# Patient Record
Sex: Female | Born: 1990 | Race: White | Hispanic: No | Smoking: Never smoker
Health system: Southern US, Community
[De-identification: ages and names within clinical notes are randomized; demographics above are authoritative.]

## PROBLEM LIST (undated history)

## (undated) DIAGNOSIS — D693 Immune thrombocytopenic purpura: Secondary | ICD-10-CM

## (undated) DIAGNOSIS — Z973 Presence of spectacles and contact lenses: Secondary | ICD-10-CM

## (undated) DIAGNOSIS — N809 Endometriosis, unspecified: Secondary | ICD-10-CM

## (undated) DIAGNOSIS — Q5128 Other doubling of uterus, other specified: Secondary | ICD-10-CM

## (undated) DIAGNOSIS — R351 Nocturia: Secondary | ICD-10-CM

## (undated) DIAGNOSIS — K589 Irritable bowel syndrome without diarrhea: Secondary | ICD-10-CM

## (undated) DIAGNOSIS — Z87442 Personal history of urinary calculi: Secondary | ICD-10-CM

## (undated) DIAGNOSIS — Q512 Other doubling of uterus, unspecified: Secondary | ICD-10-CM

## (undated) DIAGNOSIS — R350 Frequency of micturition: Secondary | ICD-10-CM

---
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
## 2012-02-17 HISTORY — PX: URETEROLITHOTOMY: SHX71

## 2015-01-17 DIAGNOSIS — D693 Immune thrombocytopenic purpura: Secondary | ICD-10-CM | POA: Diagnosis present

## 2015-07-12 DIAGNOSIS — R000 Tachycardia, unspecified: Secondary | ICD-10-CM | POA: Diagnosis not present

## 2015-07-16 DIAGNOSIS — N949 Unspecified condition associated with female genital organs and menstrual cycle: Secondary | ICD-10-CM | POA: Diagnosis not present

## 2015-07-23 DIAGNOSIS — N946 Dysmenorrhea, unspecified: Secondary | ICD-10-CM | POA: Diagnosis not present

## 2015-08-16 ENCOUNTER — Encounter (HOSPITAL_BASED_OUTPATIENT_CLINIC_OR_DEPARTMENT_OTHER): Payer: Self-pay | Admitting: *Deleted

## 2015-08-16 NOTE — Progress Notes (Signed)
NPO AFTER MN.  ARRIVE AT 0900.  [facility], TO BE FAXED.  WILL TAKE [place] AM DOS W/ SIPS OF WATER.

## 2015-08-22 DIAGNOSIS — D649 Anemia, unspecified: Secondary | ICD-10-CM | POA: Diagnosis not present

## 2015-08-26 NOTE — H&P (Signed)
Heidi Blair is a 25 y.o. female , originally referred to me by Dr. Juliene Pina, for partial uterine septum.  She reports dysmenorrhea and pelvic pain as well as a history of Thrombocytopenia.  Patient would like to preserve her childbearing potential.  Pertinent Gynecological History: Menses: Normal Bleeding: Normal Contraception: Nuvaring DES exposure: denies Blood transfusions: none Sexually transmitted diseases: no past history Previous GYN Procedures: No  Last mammogram: normal Last pap: normal  OB History: G 0   Menstrual History: Menarche age: 89 No LMP recorded.    Past Medical History  Diagnosis Date  . Endometriosis   . Uterus, septate   . ITP (idiopathic thrombocytopenic purpura)     mild - dx 2011-- per pt stable and montiored by pcp (dr Valentino Hue)  . History of kidney stones   . IBS (irritable bowel syndrome)     intermittant diarrhea  . Frequency of urination   . Nocturia   . Wears glasses                     Past Surgical History  Procedure Laterality Date  . Wisdom tooth extraction  2011  . Colonoscopy  2011  . Ureterolithotomy  2014             History reviewed. No pertinent family history. No hereditary disease.  No cancer of breast, ovary, uterus. No cutaneous leiomyomatosis or renal cell carcinoma.  Social History   Social History  . Marital Status: Single    Spouse Name: N/A  . Number of Children: N/A  . Years of Education: N/A   Occupational History  . Not on file.   Social History Main Topics  . Smoking status: Never Smoker   . Smokeless tobacco: Never Used  . Alcohol Use: Yes     Comment: RARE  . Drug Use: No  . Sexual Activity: Not on file   Other Topics Concern  . Not on file   Social History Narrative  . No narrative on file    Allergies  Allergen Reactions  . Hydrocodone Nausea And Vomiting    SEVERE    No current facility-administered medications on file prior to encounter.   No current outpatient  prescriptions on file prior to encounter.     Review of Systems  Constitutional: Negative.   HENT: Negative.   Eyes: Negative.   Respiratory: Negative.   Cardiovascular: Negative.   Gastrointestinal: Negative.   Genitourinary: Negative.   Musculoskeletal: Negative.   Skin: Negative.   Neurological: Negative.   Endo/Heme/Allergies: Negative.   Psychiatric/Behavioral: Negative.      Physical Exam  Ht  (1.575 m)  Wt 58.968 kg (130 lb)  BMI 23.77 kg/m2  LMP 08/02/2015 (Approximate) Constitutional: She is oriented to person, place, and time. She appears well-developed and well-nourished.  HENT:  Head: Normocephalic and atraumatic.  Nose: Nose normal.  Mouth/Throat: Oropharynx is clear and moist. No oropharyngeal exudate.  Eyes: Conjunctivae normal and EOM are normal. Pupils are equal, round, and reactive to light. No scleral icterus.  Neck: Normal range of motion. Neck supple. No tracheal deviation present. No thyromegaly present.  Cardiovascular: Normal rate.   Respiratory: Effort normal and breath sounds normal.  GI: Soft. Bowel sounds are normal. She exhibits no distension and no mass. There is no tenderness.  Lymphadenopathy:    She has no cervical adenopathy.  Neurological: She is alert and orieEMBERLYNN RIGGAN, place, and time. She has normal reflexes.  Skin: Skin is warm.  Psychiatric: She has a normal mood and affect. Her behavior is normal. Judgment and thought content normal.       Assessment/Plan:   I discussed the options for treatment of patient's symptoms with her: medical treatment of dysmenorrhea versus hysteroscopy and incision of septum (which would allow us to insert the Mirena), versus hysteroscopy and incision of septum with laparoscopy and possible ablation and excision of endometriosis. I reviewed the benefits, risks, as well as expected chances of future improvements. I specifically reviewed reported increase in obstetric complications such as  miscarriage, malpresentation, premature birth due to uterine septum.  Case series showed a decrease in such complications following hysteroscopic incision of uterine septum. Patient would like to proceed with both hysteroscopic incision of septum and laparoscopy and excision of endometriosis

## 2015-08-27 ENCOUNTER — Encounter (HOSPITAL_BASED_OUTPATIENT_CLINIC_OR_DEPARTMENT_OTHER)
Admission: RE | Disposition: A | Discharge status: Home / Self Care | Payer: Self-pay | Source: Ambulatory Visit | Attending: Obstetrics and Gynecology

## 2015-08-27 ENCOUNTER — Ambulatory Visit (HOSPITAL_BASED_OUTPATIENT_CLINIC_OR_DEPARTMENT_OTHER)
Admission: RE | Admit: 2015-08-27 | Discharge: 2015-08-27 | Disposition: A | Discharge status: Home / Self Care | Payer: BLUE CROSS/BLUE SHIELD | Source: Ambulatory Visit | Attending: Obstetrics and Gynecology | Admitting: Obstetrics and Gynecology

## 2015-08-27 ENCOUNTER — Ambulatory Visit (HOSPITAL_BASED_OUTPATIENT_CLINIC_OR_DEPARTMENT_OTHER): Payer: BLUE CROSS/BLUE SHIELD | Admitting: Anesthesiology

## 2015-08-27 ENCOUNTER — Encounter (HOSPITAL_BASED_OUTPATIENT_CLINIC_OR_DEPARTMENT_OTHER): Payer: Self-pay | Admitting: *Deleted

## 2015-08-27 DIAGNOSIS — N803 Endometriosis of pelvic peritoneum: Secondary | ICD-10-CM | POA: Diagnosis not present

## 2015-08-27 DIAGNOSIS — N808 Other endometriosis: Secondary | ICD-10-CM | POA: Insufficient documentation

## 2015-08-27 DIAGNOSIS — Q512 Other doubling of uterus: Secondary | ICD-10-CM | POA: Insufficient documentation

## 2015-08-27 DIAGNOSIS — R102 Pelvic and perineal pain: Secondary | ICD-10-CM | POA: Diagnosis not present

## 2015-08-27 DIAGNOSIS — N809 Endometriosis, unspecified: Secondary | ICD-10-CM | POA: Diagnosis not present

## 2015-08-27 HISTORY — DX: Endometriosis, unspecified: N80.9

## 2015-08-27 HISTORY — DX: Presence of spectacles and contact lenses: Z97.3

## 2015-08-27 HISTORY — DX: Frequency of micturition: R35.0

## 2015-08-27 HISTORY — DX: Immune thrombocytopenic purpura: D69.3

## 2015-08-27 HISTORY — DX: Other and unspecified doubling of uterus: Q51.28

## 2015-08-27 HISTORY — PX: HYSTEROSCOPY: SHX211

## 2015-08-27 HISTORY — DX: Nocturia: R35.1

## 2015-08-27 HISTORY — DX: Personal history of urinary calculi: Z87.442

## 2015-08-27 HISTORY — DX: Irritable bowel syndrome without diarrhea: K58.9

## 2015-08-27 HISTORY — DX: Other doubling of uterus, unspecified: Q51.20

## 2015-08-27 HISTORY — PX: LAPAROSCOPY: SHX197

## 2015-08-27 LAB — POCT HEMOGLOBIN-HEMACUE: Hemoglobin: 13.4 g/dL (ref 12.0–15.0)

## 2015-08-27 LAB — POCT PREGNANCY, URINE: PREG TEST UR: NEGATIVE

## 2015-08-27 SURGERY — HYSTEROSCOPY
Anesthesia: General | Site: Abdomen

## 2015-08-27 MED ORDER — DEXAMETHASONE SODIUM PHOSPHATE 4 MG/ML IJ SOLN
INTRAMUSCULAR | Status: DC | PRN
  Administered 2015-08-27: 8 mg via INTRAVENOUS

## 2015-08-27 MED ORDER — PROPOFOL 10 MG/ML IV BOLUS
INTRAVENOUS | Status: AC
  Filled 2015-08-27: qty 20

## 2015-08-27 MED ORDER — ONDANSETRON HCL 4 MG/2ML IJ SOLN
4.0000 mg | Freq: Once | INTRAMUSCULAR | Status: DC | PRN
  Filled 2015-08-27: qty 2

## 2015-08-27 MED ORDER — SODIUM CHLORIDE 0.9 % IR SOLN
Status: DC | PRN
  Administered 2015-08-27: 3000 mL
  Administered 2015-08-27: 1000 mL

## 2015-08-27 MED ORDER — FENTANYL CITRATE (PF) 250 MCG/5ML IJ SOLN
INTRAMUSCULAR | Status: AC
  Filled 2015-08-27: qty 5

## 2015-08-27 MED ORDER — PROPOFOL 10 MG/ML IV BOLUS
INTRAVENOUS | Status: DC | PRN
  Administered 2015-08-27: 100 mg via INTRAVENOUS

## 2015-08-27 MED ORDER — MIDAZOLAM HCL 2 MG/2ML IJ SOLN
INTRAMUSCULAR | Status: AC
  Filled 2015-08-27: qty 2

## 2015-08-27 MED ORDER — TRAMADOL HCL 50 MG PO TABS: 100.0000 mg | ORAL_TABLET | Freq: Four times a day (QID) | ORAL | Status: DC | PRN

## 2015-08-27 MED ORDER — ROCURONIUM BROMIDE 100 MG/10ML IV SOLN
INTRAVENOUS | Status: DC | PRN
  Administered 2015-08-27: 50 mg via INTRAVENOUS

## 2015-08-27 MED ORDER — ONDANSETRON HCL 4 MG/2ML IJ SOLN
INTRAMUSCULAR | Status: DC | PRN
  Administered 2015-08-27: 4 mg via INTRAVENOUS

## 2015-08-27 MED ORDER — LIDOCAINE HCL (CARDIAC) 20 MG/ML IV SOLN
INTRAVENOUS | Status: DC | PRN
  Administered 2015-08-27: 40 mg via INTRAVENOUS

## 2015-08-27 MED ORDER — MEPERIDINE HCL 25 MG/ML IJ SOLN
6.2500 mg | INTRAMUSCULAR | Status: DC | PRN
  Filled 2015-08-27: qty 1

## 2015-08-27 MED ORDER — HYDROMORPHONE HCL 1 MG/ML IJ SOLN
INTRAMUSCULAR | Status: AC
  Filled 2015-08-27: qty 1

## 2015-08-27 MED ORDER — CEFAZOLIN SODIUM-DEXTROSE 2-4 GM/100ML-% IV SOLN
2.0000 g | INTRAVENOUS | Status: AC
  Administered 2015-08-27: 2 g via INTRAVENOUS
  Filled 2015-08-27: qty 100

## 2015-08-27 MED ORDER — SUGAMMADEX SODIUM 200 MG/2ML IV SOLN
INTRAVENOUS | Status: AC
  Filled 2015-08-27: qty 2

## 2015-08-27 MED ORDER — ESTRADIOL 2 MG PO TABS: 3.0000 mg | ORAL_TABLET | Freq: Two times a day (BID) | ORAL | Status: DC

## 2015-08-27 MED ORDER — DEXAMETHASONE SODIUM PHOSPHATE 10 MG/ML IJ SOLN
INTRAMUSCULAR | Status: AC
  Filled 2015-08-27: qty 1

## 2015-08-27 MED ORDER — FENTANYL CITRATE (PF) 100 MCG/2ML IJ SOLN
INTRAMUSCULAR | Status: DC | PRN
  Administered 2015-08-27: 150 ug via INTRAVENOUS
  Administered 2015-08-27 (×2): 50 ug via INTRAVENOUS

## 2015-08-27 MED ORDER — ONDANSETRON HCL 4 MG/2ML IJ SOLN
INTRAMUSCULAR | Status: AC
  Filled 2015-08-27: qty 2

## 2015-08-27 MED ORDER — SUGAMMADEX SODIUM 200 MG/2ML IV SOLN
INTRAVENOUS | Status: DC | PRN
  Administered 2015-08-27: 125.2 mg via INTRAVENOUS

## 2015-08-27 MED ORDER — ONDANSETRON HCL 4 MG PO TABS: 4.0000 mg | ORAL_TABLET | Freq: Three times a day (TID) | ORAL | Status: DC | PRN

## 2015-08-27 MED ORDER — VASOPRESSIN 20 UNIT/ML IV SOLN
INTRAVENOUS | Status: DC | PRN
  Administered 2015-08-27: 1 mL via INTRAMUSCULAR
  Administered 2015-08-27: 8 mL via INTRAMUSCULAR

## 2015-08-27 MED ORDER — CEFAZOLIN SODIUM-DEXTROSE 2-4 GM/100ML-% IV SOLN
INTRAVENOUS | Status: AC
  Filled 2015-08-27: qty 100

## 2015-08-27 MED ORDER — HYDROMORPHONE HCL 2 MG PO TABS
ORAL_TABLET | ORAL | Status: AC
  Filled 2015-08-27: qty 1

## 2015-08-27 MED ORDER — HYDROMORPHONE HCL 1 MG/ML IJ SOLN
0.2500 mg | INTRAMUSCULAR | Status: DC | PRN
  Administered 2015-08-27: 0.25 mg via INTRAVENOUS
  Filled 2015-08-27: qty 1

## 2015-08-27 MED ORDER — LACTATED RINGERS IV SOLN
INTRAVENOUS | Status: DC
  Administered 2015-08-27 (×2): via INTRAVENOUS
  Filled 2015-08-27: qty 1000

## 2015-08-27 MED ORDER — HYDROMORPHONE HCL 2 MG PO TABS
2.0000 mg | ORAL_TABLET | Freq: Four times a day (QID) | ORAL | Status: DC | PRN
  Administered 2015-08-27: 2 mg via ORAL
  Filled 2015-08-27: qty 1

## 2015-08-27 MED ORDER — 0.9 % SODIUM CHLORIDE (POUR BTL) OPTIME
TOPICAL | Status: DC | PRN
  Administered 2015-08-27: 500 mL

## 2015-08-27 MED ORDER — MIDAZOLAM HCL 5 MG/5ML IJ SOLN
INTRAMUSCULAR | Status: DC | PRN
  Administered 2015-08-27: 2 mg via INTRAVENOUS

## 2015-08-27 SURGICAL SUPPLY — 68 items
BIPOLAR CUTTING LOOP 21FR (ELECTRODE)
BLADE SURG 11 STRL SS (BLADE) ×3 IMPLANT
CANISTER SUCTION 2500CC (MISCELLANEOUS) ×9 IMPLANT
CATH ROBINSON RED A/P 16FR (CATHETERS) ×3 IMPLANT
COVER BACK TABLE 60X90IN (DRAPES) ×3 IMPLANT
COVER MAYO STAND STRL (DRAPES) ×3 IMPLANT
DRAPE LG THREE QUARTER DISP (DRAPES) ×6 IMPLANT
DRAPE UNDERBUTTOCKS STRL (DRAPE) ×3 IMPLANT
DRSG OPSITE POSTOP 3X4 (GAUZE/BANDAGES/DRESSINGS) IMPLANT
ELECT BIPOLAR POINTED 21FR (MISCELLANEOUS)
ELECT NEEDLE TIP 2.8 STRL (NEEDLE) ×3 IMPLANT
ELECT REM PT RETURN 9FT ADLT (ELECTROSURGICAL) ×3
ELECTRODE LOOP CTNG BIPLR 21FR (MISCELLANEOUS) IMPLANT
ELECTRODE REM PT RTRN 9FT ADLT (ELECTROSURGICAL) ×2 IMPLANT
EVACUATOR SMOKE 8.L (FILTER) IMPLANT
GLOVE BIO SURGEON STRL SZ8 (GLOVE) ×3 IMPLANT
GLOVE BIOGEL PI IND STRL 8.5 (GLOVE) ×2 IMPLANT
GLOVE BIOGEL PI INDICATOR 8.5 (GLOVE) ×1
GLOVE INDICATOR 7.5 STRL GRN (GLOVE) ×9 IMPLANT
GOWN STRL REUS W/ TWL LRG LVL3 (GOWN DISPOSABLE) ×2 IMPLANT
GOWN STRL REUS W/ TWL XL LVL3 (GOWN DISPOSABLE) ×2 IMPLANT
GOWN STRL REUS W/TWL LRG LVL3 (GOWN DISPOSABLE) ×1
GOWN STRL REUS W/TWL XL LVL3 (GOWN DISPOSABLE) ×4 IMPLANT
KIT ROOM TURNOVER WOR (KITS) ×3 IMPLANT
LEGGING LITHOTOMY PAIR STRL (DRAPES) ×6 IMPLANT
LIQUID BAND (GAUZE/BANDAGES/DRESSINGS) ×3 IMPLANT
LOOP CUTTING BIPOLAR 21FR (ELECTRODE) IMPLANT
MANIPULATOR UTERINE 4.5 ZUMI (MISCELLANEOUS) ×3 IMPLANT
NEEDLE HYPO 25X1 1.5 SAFETY (NEEDLE) ×3 IMPLANT
NEEDLE INSUFFLATION 14GA 120MM (NEEDLE) ×3 IMPLANT
NEEDLE SPNL 22GX3.5 QUINCKE BK (NEEDLE) ×3 IMPLANT
NS IRRIG 500ML POUR BTL (IV SOLUTION) ×3 IMPLANT
PACK BASIN DAY SURGERY FS (CUSTOM PROCEDURE TRAY) ×3 IMPLANT
PACK LAPAROSCOPY II (CUSTOM PROCEDURE TRAY) ×3 IMPLANT
PAD OB MATERNITY 4.3X12.25 (PERSONAL CARE ITEMS) ×3 IMPLANT
PADDING ION DISPOSABLE (MISCELLANEOUS) ×3 IMPLANT
PENCIL BUTTON HOLSTER BLD 10FT (ELECTRODE) ×3 IMPLANT
POUCH SPECIMEN RETRIEVAL 10MM (ENDOMECHANICALS) IMPLANT
SCALPEL HARMONIC ACE (MISCELLANEOUS) IMPLANT
SEALER TISSUE G2 CVD JAW 45CM (ENDOMECHANICALS) IMPLANT
SEPRAFILM MEMBRANE 5X6 (MISCELLANEOUS) ×3 IMPLANT
SET IRRIG TUBING LAPAROSCOPIC (IRRIGATION / IRRIGATOR) ×3 IMPLANT
SET IRRIG Y TYPE TUR BLADDER L (SET/KITS/TRAYS/PACK) ×3 IMPLANT
SOLUTION ANTI FOG 6CC (MISCELLANEOUS) ×3 IMPLANT
SUT MNCRL AB 4-0 PS2 18 (SUTURE) ×3 IMPLANT
SUT PROLENE 0 CT 1 30 (SUTURE) IMPLANT
SUT VIC AB 2-0 CT1 27 (SUTURE)
SUT VIC AB 2-0 CT1 TAPERPNT 27 (SUTURE) IMPLANT
SUT VIC AB 2-0 CT2 27 (SUTURE) IMPLANT
SUT VIC AB 2-0 UR6 27 (SUTURE) IMPLANT
SUT VIC AB 4-0 SH 27 (SUTURE) ×1
SUT VIC AB 4-0 SH 27XBRD (SUTURE) ×2 IMPLANT
SUT VICRYL 0 TIES 12 18 (SUTURE) IMPLANT
SYR 30ML LL (SYRINGE) ×3 IMPLANT
SYR 3ML 18GX1 1/2 (SYRINGE) ×6 IMPLANT
SYR 3ML 23GX1 SAFETY (SYRINGE) ×3 IMPLANT
SYR 5ML LL (SYRINGE) ×3 IMPLANT
SYR CONTROL 10ML LL (SYRINGE) ×6 IMPLANT
SYRINGE 12CC LL (MISCELLANEOUS) ×3 IMPLANT
TOWEL OR 17X24 6PK STRL BLUE (TOWEL DISPOSABLE) ×6 IMPLANT
TRAY DSU PREP LF (CUSTOM PROCEDURE TRAY) ×3 IMPLANT
TRAY FOLEY CATH SILVER 14FR (SET/KITS/TRAYS/PACK) ×3 IMPLANT
TROCAR OPTI TIP 5M 100M (ENDOMECHANICALS) ×9 IMPLANT
TROCAR XCEL DIL TIP R 11M (ENDOMECHANICALS) IMPLANT
TUBE CONNECTING 12X1/4 (SUCTIONS) ×3 IMPLANT
TUBING INSUFFLATION 10FT LAP (TUBING) ×6 IMPLANT
WARMER LAPAROSCOPE (MISCELLANEOUS) ×3 IMPLANT
WATER STERILE IRR 500ML POUR (IV SOLUTION) ×3 IMPLANT

## 2015-08-27 NOTE — Anesthesia Procedure Notes (Signed)
Procedure Name: Intubation Date/Time: 08/27/2015 11:05 AM Performed by: Rica RecordsICKELTON, Babe Clenney Pre-anesthesia Checklist: Patient identified, Emergency Drugs available, Suction available and Patient being monitored Patient Re-evaluated:Patient Re-evaluated prior to inductionOxygen Delivery Method: Circle system utilized Preoxygenation: Pre-oxygenation with 100% oxygen Intubation Type: IV induction Ventilation: Mask ventilation without difficulty Laryngoscope Size: Miller and 2 Grade View: Grade II Tube type: Oral Number of attempts: 1 Airway Equipment and Method: Stylet Placement Confirmation: ETT inserted through vocal cords under direct vision and breath sounds checked- equal and bilateral Secured at: 22 cm Tube secured with: Tape Dental Injury: Teeth and Oropharynx as per pre-operative assessment

## 2015-08-27 NOTE — Discharge Instructions (Signed)

## 2015-08-27 NOTE — Transfer of Care (Signed)
Immediate Anesthesia Transfer of Care Note  Patient: Heidi BaldyJennifer M Haynie  Procedure(s) Performed: Procedure(s): HYSTEROSCOPY, RESECTION OF UTERINE SEPTUM,  INSERTION OF SEPRAFILM CHANNEL  (N/A) LAPAROSCOPY,  EXCISION OF ENDOMETROSIS, CHROMOTUBATION, LYSIS OF ADHESIONS (N/A)  Patient Location: PACU  Anesthesia Type:General  Level of Consciousness: awake, alert  and oriented  Airway & Oxygen Therapy: Patient Spontanous Breathing and Patient connected to nasal cannula oxygen  Post-op Assessment: Report given to RN and Post -op Vital signs reviewed and stable  Post vital signs: Reviewed and stable  Last Vitals:  Filed Vitals:   08/27/15 0841  BP: 125/72  Pulse: 93  Temp: 37.8 C  Resp: 20    Last Pain:  Filed Vitals:   08/27/15 0922  PainSc: 1       Patients Stated Pain Goal: 7 (08/27/15 0857)  Complications: No apparent anesthesia complications

## 2015-08-27 NOTE — Anesthesia Preprocedure Evaluation (Addendum)
Anesthesia Evaluation  Patient identified by MRN, date of birth, ID band Patient awake    Reviewed: Allergy & Precautions, NPO status , Patient's Chart, lab work & pertinent test results  Airway Mallampati: I  TM Distance: >3 FB Neck ROM: Full    Dental   Pulmonary neg pulmonary ROS,    Pulmonary exam normal        Cardiovascular Exercise Tolerance: Good negative cardio ROS Normal cardiovascular exam     Neuro/Psych negative neurological ROS  negative psych ROS   GI/Hepatic negative GI ROS, Neg liver ROS,   Endo/Other  negative endocrine ROS  Renal/GU negative Renal ROS  negative genitourinary   Musculoskeletal negative musculoskeletal ROS (+)   Abdominal   Peds negative pediatric ROS (+)  Hematology negative hematology ROS (+)   Anesthesia Other Findings   Reproductive/Obstetrics negative OB ROS                            Anesthesia Physical Anesthesia Plan  ASA: II  Anesthesia Plan: General   Post-op Pain Management:    Induction: Intravenous  Airway Management Planned: Oral ETT  Additional Equipment:   Intra-op Plan:   Post-operative Plan: Extubation in OR  Informed Consent: I have reviewed the patients History and Physical, chart, labs and discussed the procedure including the risks, benefits and alternatives for the proposed anesthesia with the patient or authorized representative who has indicated his/her understanding and acceptance.     Plan Discussed with: CRNA and Surgeon  Anesthesia Plan Comments:         Anesthesia Quick Evaluation

## 2015-08-27 NOTE — Anesthesia Postprocedure Evaluation (Signed)
Anesthesia Post Note  Patient: Heidi Blair  Procedure(s) Performed: Procedure(s) (LRB): HYSTEROSCOPY, RESECTION OF UTERINE SEPTUM,  INSERTION OF SEPRAFILM CHANNEL  (N/A) LAPAROSCOPY,  EXCISION OF ENDOMETROSIS, CHROMOTUBATION, LYSIS OF ADHESIONS (N/A)  Patient location during evaluation: PACU Anesthesia Type: General Level of consciousness: awake and alert Pain management: pain level controlled Vital Signs Assessment: post-procedure vital signs reviewed and stable Respiratory status: spontaneous breathing, nonlabored ventilation, respiratory function stable and patient connected to nasal cannula oxygen Cardiovascular status: stable and blood pressure returned to baseline Anesthetic complications: no    Last Vitals:  Filed Vitals:   08/27/15 1415 08/27/15 1533  BP: 111/63 106/65  Pulse: 94 93  Temp:  36.9 C  Resp: 12 16    Last Pain:  Filed Vitals:   08/27/15 1534  PainSc: 2                  Cathy Crounse DAVID

## 2015-08-27 NOTE — Anesthesia Postprocedure Evaluation (Signed)
Anesthesia Post Note  Patient: Violet BaldyJennifer M Jorge  Procedure(s) Performed: Procedure(s) (LRB): HYSTEROSCOPY, RESECTION OF UTERINE SEPTUM,  INSERTION OF SEPRAFILM CHANNEL  (N/A) LAPAROSCOPY,  EXCISION OF ENDOMETROSIS, CHROMOTUBATION, LYSIS OF ADHESIONS (N/A)  Patient location during evaluation: PACU Anesthesia Type: General Level of consciousness: awake and alert and oriented Pain management: pain level controlled Vital Signs Assessment: post-procedure vital signs reviewed and stable Respiratory status: spontaneous breathing, nonlabored ventilation and respiratory function stable Cardiovascular status: blood pressure returned to baseline and stable Postop Assessment: no signs of nausea or vomiting Anesthetic complications: no    Last Vitals:  Filed Vitals:   08/27/15 1400 08/27/15 1415  BP: 110/61 111/63  Pulse: 95 94  Temp:    Resp: 17 12    Last Pain:  Filed Vitals:   08/27/15 1416  PainSc: 2                  Bodie Abernethy A.

## 2015-08-29 NOTE — Op Note (Signed)
08/27/2015  11:54 AM   PRE-OPERATIVE DIAGNOSIS:  ENDOMETRIOSIS, COMPLETE UTERINE SEPTUM  POST-OPERATIVE DIAGNOSIS:  ENDOMETRIOSIS OF PELVIC PERITONEUM, STAGE I, COMPLETE UTERINE SEPTUM  PROCEDURE:  HYSTEROSCOPY, RESECTION OF UTERINE SEPTUM,  INSERTION OF SEPRAFILM STENT,  LAPAROSCOPY,  EXCISION OF ENDOMETROSIS, CHROMOTUBATION, LYSIS OF ADHESIONS   SURGEON:  Surgeon(s) and Role:    * Fermin Schwab, MD - Primary  ANESTHESIA:   general  EBL:  Total I/O In: 2140 [P.O.:240; I.V.:1900] Out: 170 [Urine:150; Blood:20]   LOCAL MEDICATIONS USED:  BUPIVICAINE   SPECIMEN:  Peritoneal biopsies from left and right uterosacral ligament to pathology  FINDINGS: On exam under anesthesia, external genitalia, Bartholin's, Skene's, urethra were normal.  The vagina was normal.  There was one cervical opening.  Bimanual exam showed them normal size uterus with a straight fundus.  There were no adnexal masses palpable.  No posterior cul-de-sac nodularity was palpable. Uterus sounded to 7 cm.  Endocervical canal was normal.  Just above the internal cervical os, a thin midline uterine septum started and extended all the way up to the fundus. Both tubal ostia were seen.  The endometrium appeared thin, as the patient has been on oral contraceptives. On laparoscopy, liver edge, gallbladder, diaphragm surfaces were normal.  The appendix was not visualized. Anterior cul-de-sac peritoneum was normal. The uterine fundus appeared normal. There was a pigmented lesion on the right proximal tubal insertion site.  This was not ablated because of its proximity to the fallopian tube. There were scattered lesions of brown pigmented endometriosis throughout the posterior cul-de-sac. These were superficial.  There were also brown lesions with surrounding fibrosis that appear to penetrate deeper, at the insertion site of both uterosacral ligaments. Additionally there were numerous lesions of brown pigmented endometriosis on  the left ovarian fossa.  There were 3 spots of brown lesions on the right ovarian fossa peritoneum.  All of these were ablated. Both fallopian tubes appeared normal proximally and distally. Fimbria were rated at 5 out of 5. Both ovaries appeared normal, with smooth surfaces. The left tube was patent to chromotubation.  The right tube did not take up the methylene blue but we think this may have been an artifact.  DESCRIPTION OF THE PROCEDURE: Patient was placed in dorsal supine position. General anesthesia was  administered. She was placed in lithotomy position. She was prepped and draped in  . A vaginal speculum was placed. A dilute vasopressin solution containing 0.33 units per milliliter was injected into the cervical stroma x5 cc. A Slimline hysteroscope with 30 lens was inserted into the canal and above findings were noted. Distention medium was normal saline. Distention method was a hysteroscopic pump set at 80 mm mercury. Above findings were noted.  The patient has used Cytotec 800 mcg the night before the procedure.  Therefore her cervix was sufficiently dilated.  We switched to 8 mm slimline bipolar resectoscope with 12 lens which had a knife electrode attached to it.  Using 3 out of 5 setting in the cutting mode and 0 out of 5 in the coagulating mode, the septum was carefully incised from caudal to cephalad direction.  We continued to dissection in a single plane up to the level of an imaginary line connecting the 2 tubal ostia.  Normal uterine configuration was achieved.  Hemostasis was checked.  The procedure was terminated and a ZUMI catheter was inserted and its balloon was inflated with 3 cc of saline, for use as a uterine manipulator and methylene blue injector during the  laparoscopy. No hysteroscopic deficit was noted for this portion of the procedure. The surgeon was then regloved and an operative field was created on the abdomen. After preemptive infiltration of all incision sites with  0.25% bupivacaine with 1 in 200,000 an intraumbilical 5 mm long vertical skin incision was made. A Verress needle was inserted and its correct location was verified. A pneumoperitoneum was created with carbon dioxide.  A trocar was inserted and a 30 5 mm laparoscope was inserted.  Video laparoscopy was started. Under direct visualization 2 more 5 mm incisions were made, one in each lower quadrant.  Corresponding sleeves were inserted and these ports were used to insert operative ancillary instruments throughout the procedure. Above findings were noted.  The patient was placed in Trendelenburg position. We used a needle electrode with 35 W of cutting current to excise the deeper lesions of endometriosis on the uterosacral ligaments in a discoid manner. We then used hydrodissection method to elevate the peritoneum in both ovarian fossa.  Using needle electrode in 35 W of cutting current setting, the brown lesions of endometriosis were vaporized until a healthy subperitoneal tissue was encountered. Chromotubation was performed and left tubal patency was confirmed. Pelvis was copiously irrigated and then aspirated. Hemostasis and lap pad count and instrument counts were checked.  Instruments were removed.  The gas was allowed to escape.  Dermabond was applied to all 3 skin incisions. As an intrauterine adhesion barrier, we rolled up a large sheet of Seprafilm around smooth DeBakey forceps and inserted it to 7 cm into the uterus and trimmed it off. The patient tolerated the procedure well and was transferred to recovery room in satisfactory condition.  Fermin SchwabYALCINKAYA,Eulamae Greenstein, MD

## 2015-08-30 ENCOUNTER — Encounter (HOSPITAL_BASED_OUTPATIENT_CLINIC_OR_DEPARTMENT_OTHER): Payer: Self-pay | Admitting: Obstetrics and Gynecology

## 2015-09-23 DIAGNOSIS — N809 Endometriosis, unspecified: Secondary | ICD-10-CM | POA: Diagnosis not present

## 2015-11-28 ENCOUNTER — Emergency Department (HOSPITAL_BASED_OUTPATIENT_CLINIC_OR_DEPARTMENT_OTHER)
Admission: EM | Admit: 2015-11-28 | Discharge: 2015-11-28 | Disposition: A | Discharge status: Home / Self Care | Payer: BLUE CROSS/BLUE SHIELD | Attending: Emergency Medicine | Admitting: Emergency Medicine

## 2015-11-28 ENCOUNTER — Emergency Department (HOSPITAL_BASED_OUTPATIENT_CLINIC_OR_DEPARTMENT_OTHER): Payer: BLUE CROSS/BLUE SHIELD

## 2015-11-28 ENCOUNTER — Encounter (HOSPITAL_BASED_OUTPATIENT_CLINIC_OR_DEPARTMENT_OTHER): Payer: Self-pay | Admitting: Emergency Medicine

## 2015-11-28 DIAGNOSIS — K529 Noninfective gastroenteritis and colitis, unspecified: Secondary | ICD-10-CM | POA: Diagnosis not present

## 2015-11-28 DIAGNOSIS — R1032 Left lower quadrant pain: Secondary | ICD-10-CM | POA: Insufficient documentation

## 2015-11-28 DIAGNOSIS — R112 Nausea with vomiting, unspecified: Secondary | ICD-10-CM | POA: Diagnosis present

## 2015-11-28 LAB — CBC
HEMATOCRIT: 44.5 % (ref 36.0–46.0)
HEMOGLOBIN: 15.4 g/dL — AB (ref 12.0–15.0)
MCH: 29.6 pg (ref 26.0–34.0)
MCHC: 34.6 g/dL (ref 30.0–36.0)
MCV: 85.6 fL (ref 78.0–100.0)
Platelets: 120 10*3/uL — ABNORMAL LOW (ref 150–400)
RBC: 5.2 MIL/uL — ABNORMAL HIGH (ref 3.87–5.11)
RDW: 12.4 % (ref 11.5–15.5)
WBC: 12.1 10*3/uL — ABNORMAL HIGH (ref 4.0–10.5)

## 2015-11-28 LAB — COMPREHENSIVE METABOLIC PANEL
ALBUMIN: 4 g/dL (ref 3.5–5.0)
ALT: 13 U/L — ABNORMAL LOW (ref 14–54)
AST: 23 U/L (ref 15–41)
Alkaline Phosphatase: 36 U/L — ABNORMAL LOW (ref 38–126)
Anion gap: 14 (ref 5–15)
BILIRUBIN TOTAL: 0.9 mg/dL (ref 0.3–1.2)
BUN: 16 mg/dL (ref 6–20)
CHLORIDE: 105 mmol/L (ref 101–111)
CO2: 17 mmol/L — AB (ref 22–32)
Calcium: 8.9 mg/dL (ref 8.9–10.3)
Creatinine, Ser: 0.74 mg/dL (ref 0.44–1.00)
GFR calc Af Amer: >60 mL/min (ref >60)
GFR calc non Af Amer: >60 mL/min (ref >60)
GLUCOSE: 109 mg/dL — AB (ref 65–99)
POTASSIUM: 3.5 mmol/L (ref 3.5–5.1)
SODIUM: 136 mmol/L (ref 135–145)
TOTAL PROTEIN: 8 g/dL (ref 6.5–8.1)

## 2015-11-28 LAB — I-STAT CG4 LACTIC ACID, ED
Lactic Acid, Venous: 2.66 mmol/L (ref 0.5–1.9)
Lactic Acid, Venous: 1.21 mmol/L (ref 0.5–1.9)

## 2015-11-28 LAB — URINALYSIS, ROUTINE W REFLEX MICROSCOPIC
BILIRUBIN URINE: NEGATIVE
GLUCOSE, UA: NEGATIVE mg/dL
KETONES UR: NEGATIVE mg/dL
Leukocytes, UA: NEGATIVE
Nitrite: NEGATIVE
PH: 6 (ref 5.0–8.0)
Protein, ur: NEGATIVE mg/dL
SPECIFIC GRAVITY, URINE: 1.018 (ref 1.005–1.030)

## 2015-11-28 LAB — PREGNANCY, URINE: Preg Test, Ur: NEGATIVE

## 2015-11-28 LAB — URINE MICROSCOPIC-ADD ON

## 2015-11-28 LAB — LIPASE, BLOOD: LIPASE: 13 U/L (ref 11–51)

## 2015-11-28 MED ORDER — SODIUM CHLORIDE 0.9 % IV BOLUS (SEPSIS)
1000.0000 mL | Freq: Once | INTRAVENOUS | Status: AC
  Administered 2015-11-28: 1000 mL via INTRAVENOUS

## 2015-11-28 MED ORDER — ACETAMINOPHEN 325 MG PO TABS
650.0000 mg | ORAL_TABLET | Freq: Once | ORAL | Status: AC
  Administered 2015-11-28: 650 mg via ORAL
  Filled 2015-11-28: qty 2

## 2015-11-28 MED ORDER — CIPROFLOXACIN HCL 500 MG PO TABS
500.0000 mg | ORAL_TABLET | Freq: Once | ORAL | Status: AC
  Administered 2015-11-28: 500 mg via ORAL
  Filled 2015-11-28: qty 1

## 2015-11-28 MED ORDER — ONDANSETRON 4 MG PO TBDP: 4.0000 mg | ORAL_TABLET | Freq: Three times a day (TID) | ORAL | 0 refills | Status: DC | PRN

## 2015-11-28 MED ORDER — CIPROFLOXACIN HCL 500 MG PO TABS: 500.0000 mg | ORAL_TABLET | Freq: Two times a day (BID) | ORAL | 0 refills | Status: DC

## 2015-11-28 MED ORDER — ONDANSETRON HCL 4 MG/2ML IJ SOLN
4.0000 mg | Freq: Once | INTRAMUSCULAR | Status: AC
  Administered 2015-11-28: 4 mg via INTRAVENOUS
  Filled 2015-11-28: qty 2

## 2015-11-28 MED ORDER — PIPERACILLIN-TAZOBACTAM 3.375 G IVPB 30 MIN
3.3750 g | Freq: Once | INTRAVENOUS | Status: AC
  Administered 2015-11-28: 3.375 g via INTRAVENOUS
  Filled 2015-11-28 (×2): qty 50

## 2015-11-28 MED ORDER — DICYCLOMINE HCL 10 MG PO CAPS
10.0000 mg | ORAL_CAPSULE | Freq: Once | ORAL | Status: AC
  Administered 2015-11-28: 10 mg via ORAL
  Filled 2015-11-28: qty 1

## 2015-11-28 MED ORDER — METRONIDAZOLE 500 MG PO TABS: 500.0000 mg | ORAL_TABLET | Freq: Two times a day (BID) | ORAL | 0 refills | Status: DC

## 2015-11-28 MED ORDER — PIPERACILLIN-TAZOBACTAM 3.375 G IVPB: 3.3750 g | Freq: Three times a day (TID) | INTRAVENOUS | Status: DC

## 2015-11-28 MED ORDER — FENTANYL CITRATE (PF) 100 MCG/2ML IJ SOLN
50.0000 ug | Freq: Once | INTRAMUSCULAR | Status: AC
  Administered 2015-11-28: 50 ug via INTRAVENOUS
  Filled 2015-11-28: qty 2

## 2015-11-28 MED ORDER — IOPAMIDOL (ISOVUE-300) INJECTION 61%
100.0000 mL | Freq: Once | INTRAVENOUS | Status: AC | PRN
  Administered 2015-11-28: 100 mL via INTRAVENOUS

## 2015-11-28 MED ORDER — OXYCODONE-ACETAMINOPHEN 5-325 MG PO TABS: 1.0000 | ORAL_TABLET | Freq: Three times a day (TID) | ORAL | 0 refills | Status: DC | PRN

## 2015-11-28 MED ORDER — DICYCLOMINE HCL 20 MG PO TABS: 20.0000 mg | ORAL_TABLET | Freq: Two times a day (BID) | ORAL | 0 refills | Status: DC

## 2015-11-28 MED ORDER — METRONIDAZOLE 500 MG PO TABS
500.0000 mg | ORAL_TABLET | Freq: Once | ORAL | Status: AC
  Administered 2015-11-28: 500 mg via ORAL
  Filled 2015-11-28: qty 1

## 2015-11-28 NOTE — ED Provider Notes (Signed)
Pt given to me at shift change with CT abd/pelvis pending. Briefly pt is a 25 y/o female with one day of left lower quadrant abdominal pain, nausea, vomiting and watery diarrhea.  She has a history of IBS and ITP, family history of Crohn's.   Workup in the ER was significant for elevated lactic acid, leukocytosis of 12.1.  She is febrile and tachycardic. She was given IV fluids, Tylenol and Zosyn. CT scan is pertinent for jejunal small bowel wall thickening possibly infectious or inflammatory enteritis,, hepatomegaly, and nonobstructing bilateral kidney stones.  Physical Exam  Constitutional: She is oriented to person, place, and time and well-developed, well-nourished, and in no distress. No distress.  HENT:  Head: Normocephalic and atraumatic.  Nose: Nose normal.  Mouth/Throat: Oropharynx is clear and moist.  Eyes: Conjunctivae and EOM are normal. Pupils are equal, round, and reactive to light. Right eye exhibits no discharge. Left eye exhibits no discharge.  Neck: Normal range of motion.  Cardiovascular: Normal rate, regular rhythm, normal heart sounds and intact distal pulses.   Pulmonary/Chest: Effort normal. No stridor. No respiratory distress.  Abdominal: Soft. Bowel sounds are normal. She exhibits no distension. There is no rebound and no guarding.  Musculoskeletal: Normal range of motion.  Neurological: She is alert and oriented to person, place, and time. She exhibits normal muscle tone. Coordination normal.  Skin: Skin is warm and dry. She is not diaphoretic.  Psychiatric: Mood, memory, affect and judgment normal.  Nursing note and vitals reviewed.    Results for orders placed or performed during the hospital encounter of 11/28/15  Lipase, blood  Result Value Ref Range   Lipase 13 11 - 51 U/L  Comprehensive metabolic panel  Result Value Ref Range   Sodium 136 135 - 145 mmol/L   Potassium 3.5 3.5 - 5.1 mmol/L   Chloride 105 101 - 111 mmol/L   CO2 17 (L) 22 - 32 mmol/L   Glucose, Bld 109 (H) 65 - 99 mg/dL   BUN 16 6 - 20 mg/dL   Creatinine, Ser 1.61 0.44 - 1.00 mg/dL   Calcium 8.9 8.9 - 09.6 mg/dL   Total Protein 8.0 6.5 - 8.1 g/dL   Albumin 4.0 3.5 - 5.0 g/dL   AST 23 15 - 41 U/L   ALT 13 (L) 14 - 54 U/L   Alkaline Phosphatase 36 (L) 38 - 126 U/L   Total Bilirubin 0.9 0.3 - 1.2 mg/dL   GFR calc non Af Amer >60 >60 mL/min   GFR calc Af Amer >60 >60 mL/min   Anion gap 14 5 - 15  CBC  Result Value Ref Range   WBC 12.1 (H) 4.0 - 10.5 K/uL   RBC 5.20 (H) 3.87 - 5.11 MIL/uL   Hemoglobin 15.4 (H) 12.0 - 15.0 g/dL   HCT 04.5 40.9 - 81.1 %   MCV 85.6 78.0 - 100.0 fL   MCH 29.6 26.0 - 34.0 pg   MCHC 34.6 30.0 - 36.0 g/dL   RDW 91.4 78.2 - 95.6 %   Platelets 120 (L) 150 - 400 K/uL  Urinalysis, Routine w reflex microscopic  Result Value Ref Range   Color, Urine YELLOW YELLOW   APPearance CLEAR CLEAR   Specific Gravity, Urine 1.018 1.005 - 1.030   pH 6.0 5.0 - 8.0   Glucose, UA NEGATIVE NEGATIVE mg/dL   Hgb urine dipstick SMALL (A) NEGATIVE   Bilirubin Urine NEGATIVE NEGATIVE   Ketones, ur NEGATIVE NEGATIVE mg/dL   Protein, ur NEGATIVE NEGATIVE  mg/dL   Nitrite NEGATIVE NEGATIVE   Leukocytes, UA NEGATIVE NEGATIVE  Pregnancy, urine  Result Value Ref Range   Preg Test, Ur NEGATIVE NEGATIVE  Urine microscopic-add on  Result Value Ref Range   Squamous Epithelial / LPF 0-5 (A) NONE SEEN   WBC, UA 0-5 0 - 5 WBC/hpf   RBC / HPF 0-5 0 - 5 RBC/hpf   Bacteria, UA RARE (A) NONE SEEN  I-Stat CG4 Lactic Acid, ED  Result Value Ref Range   Lactic Acid, Venous 2.66 (HH) 0.5 - 1.9 mmol/L   Comment NOTIFIED PHYSICIAN   I-Stat CG4 Lactic Acid, ED  (not at  [facility])  Result Value Ref Range   Lactic Acid, Venous 1.21 0.5 - 1.9 mmol/L   Ct Abdomen Pelvis W Contrast  Result Date: 11/28/2015 CLINICAL DATA:  Nausea vomiting and diarrhea. Dysuria, left lower quadrant pain and fever EXAM: CT ABDOMEN AND PELVIS WITH CONTRAST TECHNIQUE: Multidetector CT imaging of the  abdomen and pelvis was performed using the standard protocol following bolus administration of intravenous contrast. CONTRAST:  100mL ISOVUE-300 IOPAMIDOL (ISOVUE-300) INJECTION 61% COMPARISON:  None. FINDINGS: Lower chest: Lung bases clear and without acute infiltrate or effusion. Normal heart size. Hepatobiliary: The liver size is enlarged, measuring 20 cm in craniocaudad dimension. No focal hepatic abnormality is visualized. Gallbladder contains no calcified stones. A small fold is present at the gallbladder fundus. There is no intra or extrahepatic biliary dilatation. Pancreas: Unremarkable. No pancreatic ductal dilatation or surrounding inflammatory changes. Spleen: Normal in size without focal abnormality. Adrenals/Urinary Tract: Bilateral adrenal glands appear within normal limits. Kidneys show no focal masses. No hydronephrosis. There are bilateral nonobstructing kidney stones, a stone within the he lower pole of the right kidney measures 4 mm in size. Punctate stones are present in the mid left kidney. Urinary bladder is unremarkable. Stomach/Bowel: Stomach is nonenlarged. There is no evidence of a bowel obstruction. Questionable wall thickening of jejunal small bowel loops. Contrast is present in the colon. Appendix is visualized on coronal images and is within normal limits. Vascular/Lymphatic: No significant vascular findings are present. No enlarged abdominal or pelvic lymph nodes. Reproductive: Uterus and bilateral adnexa are unremarkable. Other: No free air.  No free fluid. Musculoskeletal: No acute or significant osseous findings. IMPRESSION: 1. No evidence for bowel obstruction. Suggestion of mild bowel wall thickening of jejunal small bowel loops, findings could be secondary to infectious or inflammatory enteritis. 2. Hepatomegaly 3. Nonobstructing bilateral kidney stones Electronically Signed   By: Jasmine PangKim  Fujinaga M.D.   On: 11/28/2015 18:03    Patient's lactic acid cleared after 2 L of IV  fluids. Her tachycardia improved from a heart rate of 130s to 102.  Patient's pain was controlled while in the ER, mild. She is able tolerate PO's, and she is generally well-appearing.  Discussed findings with attending physician, Dr. Rush Landmarkegeler.  He advises cipro/flagyl and agrees with discharge home with strict return precautions.  I have reviewed return precautions with the patient, she verbalizes understanding. She will be given GI follow-up.   She was discharged home in good condition.    Danelle BerryLeisa Emmary Culbreath, PA-C 12/04/15 2156    Canary Brimhristopher J Tegeler, MD 12/04/15 2203

## 2015-11-28 NOTE — ED Notes (Signed)
Critical Lactic Acid reported to [facility]

## 2015-11-28 NOTE — ED Triage Notes (Signed)
Pt in c/o some emesis last night and diarrhea continuing today that has become liquid. States she is beginning to feel lightheaded and dehydrated. Pt is alert, interactive, ambulatory in NAD.

## 2015-11-28 NOTE — ED Provider Notes (Signed)
[facility] Provider Note   CSN: [id number] Arrival date & time: 11/28/15  1314     History   Chief Complaint Chief Complaint  Patient presents with  . Diarrhea  . Emesis    HPI Heidi Blair is a 25 y.o. female.  HPI    25 year old female presents today with nausea vomiting diarrhea. Patient reports symptoms started yesterday afternoon with diarrhea with vomiting last night. She has the vomiting. Last night, but continued to have diarrhea today. She reports the diarrhea has begun to slow down but continues to have episodes. She denies any blood in her vomit or diarrhea. Patient denies any exposure to abnormal food or drink, no close sick contacts, denies a history of the same. She does report a history of IBS, noting loose stools, but not watery like today's. She denies any recent antibiotic exposure. Patient reports fever, generalized weakness, and epigastric and left lower quadrant abdominal pain. She reports this is not severe. Patient reports she is able to tolerate crackers and small amounts of water.   Past Medical History:  Diagnosis Date  . Endometriosis   . Frequency of urination   . History of kidney stones   . IBS (irritable bowel syndrome)    intermittant diarrhea  . ITP (idiopathic thrombocytopenic purpura)    mild - dx 2011-- per pt stable and montiored by pcp (dr Valentino Huelinley holt)  . Nocturia   . Uterus, septate   . Wears glasses     There are no active problems to display for this patient.   Past Surgical History:  Procedure Laterality Date  . COLONOSCOPY  2011  . HYSTEROSCOPY N/A 08/27/2015   Procedure: HYSTEROSCOPY, RESECTION OF UTERINE SEPTUM,  INSERTION OF SEPRAFILM CHANNEL ;  Surgeon: Fermin Schwabamer Yalcinkaya, MD;  Location: [facility];  Service: Gynecology;  Laterality: N/A;  . LAPAROSCOPY N/A 08/27/2015   Procedure: LAPAROSCOPY,  EXCISION OF ENDOMETROSIS, CHROMOTUBATION, LYSIS OF ADHESIONS;  Surgeon: Fermin Schwabamer Yalcinkaya, MD;   Location: [facility];  Service: Gynecology;  Laterality: N/A;  . URETEROLITHOTOMY  2014  . WISDOM TOOTH EXTRACTION  2011    OB History    No data available       Home Medications    Prior to Admission medications   Medication Sig Start Date End Date Taking? Authorizing Provider  acetaminophen (TYLENOL) 500 MG tablet Take 500 mg by mouth every 6 (six) hours as needed.    Historical Provider, MD  citalopram (CELEXA) 20 MG tablet Take 20 mg by mouth every evening.    Historical Provider, MD  estradiol (ESTRACE) 2 MG tablet Take 1.5 tablets (3 mg total) by mouth 2 (two) times daily. 08/27/15   Fermin Schwabamer Yalcinkaya, MD  ondansetron (ZOFRAN) 4 MG tablet Take 1 tablet (4 mg total) by mouth every 8 (eight) hours as needed for nausea or vomiting. 08/27/15   Fermin Schwabamer Yalcinkaya, MD  traMADol (ULTRAM) 50 MG tablet Take 2 tablets (100 mg total) by mouth every 6 (six) hours as needed. 08/27/15   Fermin Schwabamer Yalcinkaya, MD    Family History History reviewed. No pertinent family history.  Social History Social History  Substance Use Topics  . Smoking status: Never Smoker  . Smokeless tobacco: Never Used  . Alcohol use Yes     Comment: RARE     Allergies   Hydrocodone   Review of Systems Review of Systems  All other systems reviewed and are negative.   Physical Exam Updated Vital Signs BP 106/81  Pulse 104   Temp 101.3 F (38.5 C)   Resp 21   Ht 5\' 2"  (1.575 m)   Wt 59 kg   SpO2 100%   BMI 23.78 kg/m   Physical Exam  Constitutional: She is oriented to person, place, and time. She appears well-developed and well-nourished. No distress.  HENT:  Head: Normocephalic and atraumatic.  Eyes: Conjunctivae are normal. Pupils are equal, round, and reactive to light. Right eye exhibits no discharge. Left eye exhibits no discharge. No scleral icterus.  Neck: Normal range of motion. No JVD present. No tracheal deviation present.  Pulmonary/Chest: Effort normal. No stridor.    Abdominal:  Minor TTP of the epigastric region and LLQ- no rebound or guarding     Neurological: She is alert and oriented to person, place, and time. Coordination normal.  Skin: She is not diaphoretic.  Psychiatric: She has a normal mood and affect. Her behavior is normal. Judgment and thought content normal.  Nursing note and vitals reviewed.   ED Treatments / Results  Labs (all labs ordered are listed, but only abnormal results are displayed) Labs Reviewed  COMPREHENSIVE METABOLIC PANEL - Abnormal; Notable for the following:       Result Value   CO2 17 (*)    Glucose, Bld 109 (*)    ALT 13 (*)    Alkaline Phosphatase 36 (*)    All other components within normal limits  CBC - Abnormal; Notable for the following:    WBC 12.1 (*)    RBC 5.20 (*)    Hemoglobin 15.4 (*)    Platelets 120 (*)    All other components within normal limits  URINALYSIS, ROUTINE W REFLEX MICROSCOPIC (NOT AT [facility]) - Abnormal; Notable for the following:    Hgb urine dipstick SMALL (*)    All other components within normal limits  URINE MICROSCOPIC-ADD ON - Abnormal; Notable for the following:    Squamous Epithelial / LPF 0-5 (*)    Bacteria, UA RARE (*)    All other components within normal limits  I-STAT CG4 LACTIC ACID, ED - Abnormal; Notable for the following:    Lactic Acid, Venous 2.66 (*)    All other components within normal limits  CULTURE, BLOOD (ROUTINE X 2)  CULTURE, BLOOD (ROUTINE X 2)  URINE CULTURE  LIPASE, BLOOD  PREGNANCY, URINE  I-STAT CG4 LACTIC ACID, ED    EKG  EKG Interpretation  Date/Time:  Thursday November 28 2015 13:41:29 EDT Ventricular Rate:  120 PR Interval:    QRS Duration: 99 QT Interval:  317 QTC Calculation: 448 R Axis:   83 Text Interpretation:  Sinus tachycardia LAE, consider biatrial enlargement RSR' in V1 or V2, right VCD or RVH Confirmed by Lincoln Brigham [phone]) on 11/28/2015 2:01:55 PM       Radiology No results found.  Procedures Procedures  (including critical care time)  Medications Ordered in ED Medications  piperacillin-tazobactam (ZOSYN) IVPB 3.375 g (not administered)  fentaNYL (SUBLIMAZE) injection 50 mcg (not administered)  acetaminophen (TYLENOL) tablet 650 mg (650 mg Oral Given 11/28/15 1349)  sodium chloride 0.9 % bolus 1,000 mL (0 mLs Intravenous Stopped 11/28/15 1505)  sodium chloride 0.9 % bolus 1,000 mL (0 mLs Intravenous Stopped 11/28/15 1656)  piperacillin-tazobactam (ZOSYN) IVPB 3.375 g (0 g Intravenous Stopped 11/28/15 1657)  sodium chloride 0.9 % bolus 1,000 mL (1,000 mLs Intravenous New Bag/Given 11/28/15 1658)     Initial Impression / Assessment and Plan / ED Course  I have reviewed the  triage vital signs and the nursing notes.  Pertinent labs & imaging results that were available during my care of the patient were reviewed by me and considered in my medical decision making (see chart for details).  Clinical Course    Final Clinical Impressions(s) / ED Diagnoses   Final diagnoses:  LLQ pain    Labs: blood culture, UA, urine preg, lactic acid, CMP, CBC  Imaging: Ct abd/pelvis  Consults:  Therapeutics: zosyn, NS, tylenol  Discharge Meds:   Assessment/Plan:  25 year old female presents today with nausea vomiting diarrhea and fever. She has abdominal pain, tachycardic, elevated white count and febrile, elevated lactic acid- patient has sepsis criteria sepsis called. She'll be started on Zosyn, normal saline and will receive CT scan to rule out any significant intra-abdominal pathology. No other sources of infection. Patient has a history of ITP and notes her platelets are usually in the low 100s.  Rechecked patient reports she's having more localized left lower quadrant abdominal pain, her tachycardia has mildly improved. Patient care will be signed off to oncoming provider pending CT urinalysis, reevaluation and disposition. Patient has remained stable and well-appearing while in the  ED.   New Prescriptions New Prescriptions   No medications on file     Eyvonne Mechanic, PA-C 11/28/15 1710    Tilden Fossa, MD 11/29/15 [phone]

## 2015-11-28 NOTE — Progress Notes (Signed)
Pharmacy Antibiotic Note  Heidi BaldyJennifer M Blair is a 25 y.o. female admitted on 11/28/2015 with intra-abdominal infection.  Pharmacy has been consulted for Zosyn dosing. CC emesis and diarrhea since last night. Febrile 101.30F.  Plan: Zosyn 3.375 g IV 30 min infusion x1 Zosyn 3.375 g IV extended infusion q8h Monitor clinical course  Height: 5\' 2"  (157.5 cm) Weight: 130 lb (59 kg) IBW/kg (Calculated) : 50.1  Temp (24hrs), Avg:101 F (38.3 C), Min:100.7 F (38.2 C), Max:101.3 F (38.5 C)   Recent Labs Lab 11/28/15 1330 11/28/15 1430  WBC 12.1*  --   CREATININE 0.74  --   LATICACIDVEN  --  2.66*    Estimated Creatinine Clearance: 85.8 mL/min (by C-G formula based on SCr of 0.74 mg/dL).   Likely stable  Allergies  Allergen Reactions  . Hydrocodone Nausea And Vomiting    SEVERE    Antimicrobials this admission:  Zosyn 10/12 >>  Dose adjustments this admission:  N/A  Microbiology results:  10/12 BCx: Sent 10/12 UCx: Sent Thank you for allowing pharmacy to be a part of this patient's care.  Lianne CureJustin R Lelani Garnett, PharmD Candidate 11/28/2015 3:37 PM

## 2015-11-30 LAB — URINE CULTURE: Culture: NO GROWTH

## 2015-12-03 LAB — CULTURE, BLOOD (ROUTINE X 2)
Culture: NO GROWTH
Culture: NO GROWTH

## 2015-12-11 DIAGNOSIS — F418 Other specified anxiety disorders: Secondary | ICD-10-CM | POA: Diagnosis not present

## 2015-12-11 DIAGNOSIS — N200 Calculus of kidney: Secondary | ICD-10-CM | POA: Diagnosis not present

## 2015-12-11 DIAGNOSIS — K529 Noninfective gastroenteritis and colitis, unspecified: Secondary | ICD-10-CM | POA: Diagnosis not present

## 2016-01-15 ENCOUNTER — Encounter: Payer: Self-pay | Admitting: Family Medicine

## 2016-01-15 ENCOUNTER — Encounter: Payer: Self-pay | Admitting: Gastroenterology

## 2016-01-22 ENCOUNTER — Other Ambulatory Visit (INDEPENDENT_AMBULATORY_CARE_PROVIDER_SITE_OTHER): Payer: BLUE CROSS/BLUE SHIELD

## 2016-01-22 ENCOUNTER — Ambulatory Visit (INDEPENDENT_AMBULATORY_CARE_PROVIDER_SITE_OTHER): Payer: BLUE CROSS/BLUE SHIELD | Admitting: Gastroenterology

## 2016-01-22 ENCOUNTER — Encounter: Payer: Self-pay | Admitting: Gastroenterology

## 2016-01-22 VITALS — BP 104/64 | HR 93 | Ht 62.0 in | Wt 140.0 lb

## 2016-01-22 DIAGNOSIS — R140 Abdominal distension (gaseous): Secondary | ICD-10-CM

## 2016-01-22 DIAGNOSIS — K580 Irritable bowel syndrome with diarrhea: Secondary | ICD-10-CM | POA: Diagnosis not present

## 2016-01-22 LAB — IGA: IGA: 240 mg/dL (ref 68–378)

## 2016-01-22 LAB — COMPREHENSIVE METABOLIC PANEL
ALK PHOS: 39 U/L (ref 39–117)
ALT: 11 U/L (ref 0–35)
AST: 14 U/L (ref 0–37)
Albumin: 4.1 g/dL (ref 3.5–5.2)
BILIRUBIN TOTAL: 0.4 mg/dL (ref 0.2–1.2)
BUN: 11 mg/dL (ref 6–23)
CALCIUM: 9.3 mg/dL (ref 8.4–10.5)
CO2: 26 meq/L (ref 19–32)
Chloride: 105 mEq/L (ref 96–112)
Creatinine, Ser: 0.78 mg/dL (ref 0.40–1.20)
GFR: 96.26 mL/min (ref >60.00)
GLUCOSE: 85 mg/dL (ref 70–99)
POTASSIUM: 4.1 meq/L (ref 3.5–5.1)
Sodium: 140 mEq/L (ref 135–145)
Total Protein: 7.7 g/dL (ref 6.0–8.3)

## 2016-01-22 LAB — CBC WITH DIFFERENTIAL/PLATELET
BASOS ABS: 0 10*3/uL (ref 0.0–0.1)
BASOS PCT: 0.5 % (ref 0.0–3.0)
EOS PCT: 2 % (ref 0.0–5.0)
Eosinophils Absolute: 0.1 10*3/uL (ref 0.0–0.7)
HEMATOCRIT: 40.9 % (ref 36.0–46.0)
Hemoglobin: 14.1 g/dL (ref 12.0–15.0)
LYMPHS ABS: 2.5 10*3/uL (ref 0.7–4.0)
LYMPHS PCT: 34.9 % (ref 12.0–46.0)
MCHC: 34.4 g/dL (ref 30.0–36.0)
MCV: 83.6 fl (ref 78.0–100.0)
MONOS PCT: 9 % (ref 3.0–12.0)
Monocytes Absolute: 0.6 10*3/uL (ref 0.1–1.0)
NEUTROS ABS: 3.8 10*3/uL (ref 1.4–7.7)
NEUTROS PCT: 53.6 % (ref 43.0–77.0)
PLATELETS: 155 10*3/uL (ref 150.0–400.0)
RBC: 4.89 Mil/uL (ref 3.87–5.11)
RDW: 12.4 % (ref 11.5–15.5)
WBC: 7.2 10*3/uL (ref 4.0–10.5)

## 2016-01-22 LAB — C-REACTIVE PROTEIN: CRP: 0.6 mg/dL (ref 0.5–20.0)

## 2016-01-22 LAB — SEDIMENTATION RATE: SED RATE: 22 mm/h — AB (ref 0–20)

## 2016-01-22 LAB — TSH: TSH: 2.37 u[IU]/mL (ref 0.35–4.50)

## 2016-01-22 MED ORDER — ELUXADOLINE 100 MG PO TABS: 1.0000 | ORAL_TABLET | Freq: Two times a day (BID) | ORAL | 3 refills | Status: DC

## 2016-01-22 NOTE — Patient Instructions (Signed)
Your physician has requested that you go to the basement for lab work before leaving today.  We will call you with the results of your labs.  We have sent the following medications to your pharmacy for you to pick up at your convenience: Viberzi.

## 2016-01-22 NOTE — Progress Notes (Addendum)
01/22/2016 Heidi BaldyJennifer M Blair [id number] April 20, 1990   HISTORY OF PRESENT ILLNESS:  This is a pleasant 25 year old female who is new to our practice. [place] [place] [facility] [place]. Jenean Lindauhodes. She tells me that starting around the age of 25 or [age] she began having GI symptoms, which included diarrhea. When she was 25 years old she had a colonoscopy, which she reports was normal. Her mother does have Crohn's disease and that is controlled by oral medications. She tried gluten-free diet, dairy free diet, and fodmap diet. She is tried antispasmodics including Bentyl as well as probiotics. She has continued to have diarrhea and was told that she has irritable bowel syndrome. She describes 2-3 loose to watery stools daily, usually postprandially with urgency.  Acutely, in October she developed sudden onset of vomiting, diarrhea.  She went to the emergency department and CT scan of the abdomen and pelvis with contrast showed some mild wall thickening of jejunal small bowel loops, secondary to infectious or inflammatory enteritis.  Her lactic acid was elevated as well, but after hydration return to normal. Also had a leukocytosis of 12.1.  She tells me that all of those symptoms have since resolved and she is now back to her baseline constant GI complaints. Here to establish GI care.  Past Medical History:  Diagnosis Date  . Endometriosis   . Frequency of urination   . History of kidney stones   . IBS (irritable bowel syndrome)    intermittant diarrhea  . ITP (idiopathic thrombocytopenic purpura)    mild - dx 2011-- per pt stable and montiored by pcp (dr Valentino Huelinley holt)  . Nocturia   . Uterus, septate   . Wears glasses    Past Surgical History:  Procedure Laterality Date  . COLONOSCOPY  2011  . HYSTEROSCOPY N/A 08/27/2015   Procedure: HYSTEROSCOPY, RESECTION OF UTERINE SEPTUM,  INSERTION OF SEPRAFILM CHANNEL ;  Surgeon: Fermin Schwabamer Yalcinkaya, MD;  Location: [facility];  Service: Gynecology;  Laterality: N/A;  . LAPAROSCOPY N/A 08/27/2015   Procedure: LAPAROSCOPY,  EXCISION OF ENDOMETROSIS, CHROMOTUBATION, LYSIS OF ADHESIONS;  Surgeon: Fermin Schwabamer Yalcinkaya, MD;  Location: [facility];  Service: Gynecology;  Laterality: N/A;  . URETEROLITHOTOMY  2014  . WISDOM TOOTH EXTRACTION  2011    reports that she has never smoked. She has never used smokeless tobacco. She reports that she drinks alcohol. She reports that she does not use drugs. family history is not on file. Allergies  Allergen Reactions  . Hydrocodone Nausea And Vomiting    SEVERE      Outpatient Encounter Prescriptions as of 01/22/2016  Medication Sig  . citalopram (CELEXA) 20 MG tablet Take 20 mg by mouth every evening.  . etonogestrel-ethinyl estradiol (NUVARING) 0.12-0.015 MG/24HR vaginal ring Place 1 each vaginally every 28 (twenty-eight) days. Insert vaginally and leave in place for 3 consecutive weeks, then remove for 1 week.  . [DISCONTINUED] acetaminophen (TYLENOL) 500 MG tablet Take 500 mg by mouth every 6 (six) hours as needed.  . [DISCONTINUED] ciprofloxacin (CIPRO) 500 MG tablet Take 1 tablet (500 mg total) by mouth 2 (two) times daily. (Patient not taking: Reported on 01/22/2016)  . [DISCONTINUED] dicyclomine (BENTYL) 20 MG tablet Take 1 tablet (20 mg total) by mouth 2 (two) times daily. (Patient not taking: Reported on 01/22/2016)  . [DISCONTINUED] estradiol (ESTRACE) 2 MG tablet Take 1.5 tablets (3 mg total) by mouth 2 (two) times daily. (Patient not taking: Reported  on 01/22/2016)  . [DISCONTINUED] metroNIDAZOLE (FLAGYL) 500 MG tablet Take 1 tablet (500 mg total) by mouth 2 (two) times daily. (Patient not taking: Reported on 01/22/2016)  . [DISCONTINUED] ondansetron (ZOFRAN ODT) 4 MG disintegrating tablet Take 1 tablet (4 mg total) by mouth every 8 (eight) hours as needed for nausea or vomiting. (Patient not taking: Reported on 01/22/2016)  . [DISCONTINUED] ondansetron  (ZOFRAN) 4 MG tablet Take 1 tablet (4 mg total) by mouth every 8 (eight) hours as needed for nausea or vomiting. (Patient not taking: Reported on 01/22/2016)  . [DISCONTINUED] oxyCODONE-acetaminophen (PERCOCET/ROXICET) 5-325 MG tablet Take 1 tablet by mouth every 8 (eight) hours as needed for severe pain. (Patient not taking: Reported on 01/22/2016)  . [DISCONTINUED] traMADol (ULTRAM) 50 MG tablet Take 2 tablets (100 mg total) by mouth every 6 (six) hours as needed. (Patient not taking: Reported on 01/22/2016)   No facility-administered encounter medications on file as of 01/22/2016.      REVIEW OF SYSTEMS  : All other systems reviewed and negative except where noted in the History of Present Illness.   PHYSICAL EXAM: BP 104/64   Pulse 93   Ht 5\' 2"  (1.575 m)   Wt 140 lb (63.5 kg)   SpO2 99%   BMI 25.61 kg/m  General: Well developed white female in no acute distress Head: Normocephalic and atraumatic Eyes:  Sclerae anicteric, conjunctiva pink. Ears: Normal auditory acuity Lungs: Clear throughout to auscultation Heart: Regular rate and rhythm Abdomen: Soft, non-distended.  Normal bowel sounds.  Non-tender. Musculoskeletal: Symmetrical with no gross deformities  Skin: No lesions on visible extremities Extremities: No edema  Neurological: Alert oriented x 4, grossly non-focal Psychological:  Alert and cooperative. Normal mood and affect  ASSESSMENT AND PLAN: -Long-standing diarrhea, bloating.  Diagnosed with IBS-D.  Negative colonoscopy 7 years ago.  Will obtain her records from her previous GI including colonoscopy report.  Acutely in October I believe she likely had an infectious enteritis with mild small bowel wall thickening of the jejunum on CT scan. Those acute symptoms have resolved and she is back to her baseline symptoms.  I'm going to repeat a CBC and CMP today to be sure her leukocytosis, etc. have resolved. We'll also check CRP, sedimentation rate, TSH, and celiac labs. Pending  that all of those are unremarkable then I have suggested a trial of Viberzi 100 mg BID.  **Obtained GI records from [place]. 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 Used probiotics, Imodium, dicyclomine, fiber supplements, FODMAP diet. She had a colonoscopy in February 2011 at which time the study was normal.   CC:  Marylen PontoHolt, Lynley S, MD

## 2016-01-23 LAB — TISSUE TRANSGLUTAMINASE, IGA: TISSUE TRANSGLUTAMINASE AB, IGA: 1 U/mL (ref <4)

## 2016-01-23 NOTE — Progress Notes (Signed)
I agree with the above note, plan 

## 2016-01-27 DIAGNOSIS — R0982 Postnasal drip: Secondary | ICD-10-CM | POA: Diagnosis not present

## 2016-01-27 DIAGNOSIS — J069 Acute upper respiratory infection, unspecified: Secondary | ICD-10-CM | POA: Diagnosis not present

## 2016-01-30 ENCOUNTER — Telehealth: Payer: Self-pay

## 2016-01-30 NOTE — Telephone Encounter (Signed)
-----   Message from Leta BaptistJessica D Zehr, PA-C sent at 01/30/2016 12:19 PM EST ----- Please let the patient know that all of her labs are normal.  If she is willing then we can try Viberzi 100 mg BID.  If she would like to try that then ok to send prescription and also please schedule her for OV F/U with me in a few weeks.  Thank you,  Jess

## 2016-01-30 NOTE — Telephone Encounter (Signed)
Left message on machine to call back  

## 2016-01-30 NOTE — Telephone Encounter (Signed)
Pt has been notified and will call back to set up follow up appt.  She will try viberzi and call if no response

## 2016-03-17 DIAGNOSIS — Z0000 Encounter for general adult medical examination without abnormal findings: Secondary | ICD-10-CM | POA: Diagnosis not present

## 2016-03-17 DIAGNOSIS — R6889 Other general symptoms and signs: Secondary | ICD-10-CM | POA: Diagnosis not present

## 2016-03-17 DIAGNOSIS — R300 Dysuria: Secondary | ICD-10-CM | POA: Diagnosis not present

## 2016-05-21 ENCOUNTER — Emergency Department (HOSPITAL_BASED_OUTPATIENT_CLINIC_OR_DEPARTMENT_OTHER)
Admission: EM | Admit: 2016-05-21 | Discharge: 2016-05-21 | Disposition: A | Discharge status: Home / Self Care | Payer: BLUE CROSS/BLUE SHIELD | Attending: Physician Assistant | Admitting: Physician Assistant

## 2016-05-21 ENCOUNTER — Encounter (HOSPITAL_BASED_OUTPATIENT_CLINIC_OR_DEPARTMENT_OTHER): Payer: Self-pay | Admitting: *Deleted

## 2016-05-21 DIAGNOSIS — R197 Diarrhea, unspecified: Secondary | ICD-10-CM | POA: Diagnosis not present

## 2016-05-21 LAB — CBC WITH DIFFERENTIAL/PLATELET
BASOS ABS: 0 10*3/uL (ref 0.0–0.1)
Basophils Relative: 0 %
EOS ABS: 0 10*3/uL (ref 0.0–0.7)
EOS PCT: 0 %
HCT: 40 % (ref 36.0–46.0)
Hemoglobin: 13.9 g/dL (ref 12.0–15.0)
Lymphocytes Relative: 15 %
Lymphs Abs: 1.4 10*3/uL (ref 0.7–4.0)
MCH: 29.4 pg (ref 26.0–34.0)
MCHC: 34.8 g/dL (ref 30.0–36.0)
MCV: 84.6 fL (ref 78.0–100.0)
Monocytes Absolute: 0.5 10*3/uL (ref 0.1–1.0)
Monocytes Relative: 5 %
Neutro Abs: 7.7 10*3/uL (ref 1.7–7.7)
Neutrophils Relative %: 80 %
PLATELETS: 179 10*3/uL (ref 150–400)
RBC: 4.73 MIL/uL (ref 3.87–5.11)
RDW: 12.7 % (ref 11.5–15.5)
WBC: 9.5 10*3/uL (ref 4.0–10.5)

## 2016-05-21 LAB — URINALYSIS, ROUTINE W REFLEX MICROSCOPIC
BILIRUBIN URINE: NEGATIVE
Glucose, UA: NEGATIVE mg/dL
KETONES UR: NEGATIVE mg/dL
LEUKOCYTES UA: NEGATIVE
Nitrite: NEGATIVE
PROTEIN: NEGATIVE mg/dL
Specific Gravity, Urine: 1.008 (ref 1.005–1.030)
pH: 5.5 (ref 5.0–8.0)

## 2016-05-21 LAB — BASIC METABOLIC PANEL
ANION GAP: 11 (ref 5–15)
BUN: 11 mg/dL (ref 6–20)
CALCIUM: 9.2 mg/dL (ref 8.9–10.3)
CO2: 23 mmol/L (ref 22–32)
Chloride: 105 mmol/L (ref 101–111)
Creatinine, Ser: 0.66 mg/dL (ref 0.44–1.00)
GFR calc Af Amer: >60 mL/min (ref >60)
Glucose, Bld: 87 mg/dL (ref 65–99)
POTASSIUM: 3.3 mmol/L — AB (ref 3.5–5.1)
SODIUM: 139 mmol/L (ref 135–145)

## 2016-05-21 LAB — URINALYSIS, MICROSCOPIC (REFLEX)

## 2016-05-21 LAB — I-STAT CG4 LACTIC ACID, ED: Lactic Acid, Venous: 1.16 mmol/L (ref 0.5–1.9)

## 2016-05-21 LAB — LIPASE, BLOOD: LIPASE: 21 U/L (ref 11–51)

## 2016-05-21 LAB — PREGNANCY, URINE: PREG TEST UR: NEGATIVE

## 2016-05-21 MED ORDER — ONDANSETRON 4 MG PO TBDP: 4.0000 mg | ORAL_TABLET | Freq: Three times a day (TID) | ORAL | 0 refills | Status: DC | PRN

## 2016-05-21 MED ORDER — SODIUM CHLORIDE 0.9 % IV BOLUS (SEPSIS)
1000.0000 mL | Freq: Once | INTRAVENOUS | Status: AC
  Administered 2016-05-21: 1000 mL via INTRAVENOUS

## 2016-05-21 MED ORDER — ONDANSETRON HCL 4 MG/2ML IJ SOLN
4.0000 mg | Freq: Once | INTRAMUSCULAR | Status: AC
  Administered 2016-05-21: 4 mg via INTRAVENOUS
  Filled 2016-05-21: qty 2

## 2016-05-21 MED FILL — ONDANSETRON ODT 4 MG TABLET: 4 | 2 days supply | Qty: 9 | Fill #0

## 2016-05-21 NOTE — ED Triage Notes (Signed)
Pt reports nausea and diarrhea x 2 days. ?food bourne illness after eating out 2 days ago

## 2016-05-21 NOTE — ED Provider Notes (Signed)
[facility] Provider Note   CSN: [id number] Arrival date & time: 05/21/16  1018     History   Chief Complaint Chief Complaint  Patient presents with  . Diarrhea    HPI Heidi Blair is a 26 y.o. female with h/o endometriosis, kidney stones, ITP, IBS-D presents to ED with intermittent periumbilical cramping, non bloody diarrhea, nausea x 2 days. Patient notes her symptoms started a few hours after eating [employer]. Patient notes light-headedness, which she attributes to feeling dehydrated.  Patient denies fever, headache, vomiting, bloody BMs, urinary symptoms. No recent travel or antibiotics. Patient notes her IBS is usually accompanied by similar abdominal cramping, loose stools and occasionally nausea but not watery diarrhea and usually does not last this long. Patient states today's symptoms seem similar to when she came to ED a few months ago when they told her she had GI bug and intestinal inflammation.   HPI  Past Medical History:  Diagnosis Date  . Endometriosis   . Frequency of urination   . History of kidney stones   . IBS (irritable bowel syndrome)    intermittant diarrhea  . ITP (idiopathic thrombocytopenic purpura)    mild - dx 2011-- per pt stable and montiored by pcp (dr Valentino Hue)  . Nocturia   . Uterus, septate   . Wears glasses     Patient Active Problem List   Diagnosis Date Noted  . Irritable bowel syndrome with diarrhea 01/22/2016  . Bloating 01/22/2016    Past Surgical History:  Procedure Laterality Date  . COLONOSCOPY  2011  . HYSTEROSCOPY N/A 08/27/2015   Procedure: HYSTEROSCOPY, RESECTION OF UTERINE SEPTUM,  INSERTION OF SEPRAFILM CHANNEL ;  Surgeon: Fermin Schwab, MD;  Location: [facility];  Service: Gynecology;  Laterality: N/A;  . LAPAROSCOPY N/A 08/27/2015   Procedure: LAPAROSCOPY,  EXCISION OF ENDOMETROSIS, CHROMOTUBATION, LYSIS OF ADHESIONS;  Surgeon: Fermin Schwab, MD;  Location: [facility];  Service: Gynecology;  Laterality: N/A;  . URETEROLITHOTOMY  2014  . WISDOM TOOTH EXTRACTION  2011    OB History    No data available       Home Medications    Prior to Admission medications   Medication Sig Start Date End Date Taking? Authorizing Provider  citalopram (CELEXA) 20 MG tablet Take 20 mg by mouth every evening.   Yes Historical Provider, MD  etonogestrel-ethinyl estradiol (NUVARING) 0.12-0.015 MG/24HR vaginal ring Place 1 each vaginally every 28 (twenty-eight) days. Insert vaginally and leave in place for 3 consecutive weeks, then remove for 1 week.   Yes Historical Provider, MD  Eluxadoline (VIBERZI) 100 MG TABS Take 1 tablet by mouth 2 (two) times daily. 01/22/16   Jessica D Zehr, PA-C  ondansetron (ZOFRAN ODT) 4 MG disintegrating tablet Take 1 tablet (4 mg total) by mouth every 8 (eight) hours as needed for nausea or vomiting. 05/21/16   Liberty Handy, PA-C    Family History No family history on file.  Social History Social History  Substance Use Topics  . Smoking status: Never Smoker  . Smokeless tobacco: Never Used  . Alcohol use Yes     Comment: RARE     Allergies   Hydrocodone   Review of Systems Review of Systems  Constitutional: Negative for chills, diaphoresis and fever.  HENT: Negative for congestion.   Respiratory: Negative for cough and shortness of breath.   Cardiovascular: Negative for chest pain.  Gastrointestinal: Positive for abdominal pain, diarrhea and nausea. Negative for  blood in stool and vomiting.  Genitourinary: Negative for difficulty urinating, dysuria, frequency and hematuria.  Musculoskeletal: Negative for back pain.  Skin: Negative for rash.  Neurological: Negative for headaches.     Physical Exam Updated Vital Signs BP 114/81 (BP Location: Left Arm)   Pulse (!) 101   Temp 99.2 F (37.3 C)   Resp 16   Ht  (1.575 m)   Wt 65.8 kg   LMP 04/30/2016 (Approximate)   SpO2 100%   BMI 26.52 kg/m    Physical Exam  Constitutional: She is oriented to person, place, and time. She appears well-developed and well-nourished.  HENT:  Head: Normocephalic and atraumatic.  Nose: Nose normal.  Mouth/Throat: Oropharynx is clear and moist. No oropharyngeal exudate.  Eyes: Conjunctivae and EOM are normal. Pupils are equal, round, and reactive to light.  Neck: Normal range of motion. Neck supple.  Cardiovascular: Normal rate, regular rhythm, normal heart sounds and intact distal pulses.   No murmur heard. Pulmonary/Chest: Effort normal and breath sounds normal. No respiratory distress. She has no wheezes. She has no rales. She exhibits no tenderness.  Abdominal: There is tenderness.  Surgical abdominal scars noted.  No pulsating masses.  + Bowel sounds throughout.  Periumbilical tenderness otherwise abdomen is soft without distention, rigidity, guarding or rebound.  No suprapubic tenderness. No CVAT.  Negative Murphy's. Negative McBurney's. Negative Psoas sign.  Non palpable kidneys. No hepatosplenomegaly.   Musculoskeletal: Normal range of motion. She exhibits no deformity.  Lymphadenopathy:    She has no cervical adenopathy.  Neurological: She is alert and oriented to person, place, and time. No sensory deficit.  Skin: Skin is warm and dry. Capillary refill takes less than 2 seconds.  Psychiatric: She has a normal mood and affect. Her behavior is normal. Judgment and thought content normal.  Nursing note and vitals reviewed.    ED Treatments / Results  Labs (all labs ordered are listed, but only abnormal results are displayed) Labs Reviewed  URINALYSIS, ROUTINE W REFLEX MICROSCOPIC - Abnormal; Notable for the following:       Result Value   Hgb urine dipstick TRACE (*)    All other components within normal limits  BASIC METABOLIC PANEL - Abnormal; Notable for the following:    Potassium 3.3 (*)    All other components within normal limits  URINALYSIS, MICROSCOPIC (REFLEX) -  Abnormal; Notable for the following:    Bacteria, UA RARE (*)    Squamous Epithelial / LPF 0-5 (*)    All other components within normal limits  PREGNANCY, URINE  CBC WITH DIFFERENTIAL/PLATELET  LIPASE, BLOOD  LACTIC ACID, PLASMA  LACTIC ACID, PLASMA  I-STAT CG4 LACTIC ACID, ED    EKG  EKG Interpretation None       Radiology No results found.  Procedures Procedures (including critical care time)  Medications Ordered in ED Medications  sodium chloride 0.9 % bolus 1,000 mL (1,000 mLs Intravenous New Bag/Given 05/21/16 1252)  ondansetron (ZOFRAN) injection 4 mg (4 mg Intravenous Given 05/21/16 1253)     Initial Impression / Assessment and Plan / ED Course  I have reviewed the triage vital signs and the nursing notes.  Pertinent labs & imaging results that were available during my care of the patient were reviewed by me and considered in my medical decision making (see chart for details).  Clinical Course as of May 22 1454  Thu May 21, 2016  1440 Temp: 99.3 F (37.4 C) [CG]  1440 Pulse Rate: Marland Kitchen)  134 [CG]  1440 Pulse Rate: (!) 101 [CG]  1440 BP: 114/81 [CG]  1440 Resp: 16 [CG]  1440 SpO2: 100 % [CG]  1440 Sodium: 139 [CG]  1440 Potassium: (!) 3.3 [CG]  1440 Chloride: 105 [CG]  1440 Creatinine: 0.66 [CG]  1440 Specific Gravity, Urine: 1.008 [CG]  1440 Nitrite: NEGATIVE [CG]  1440 BUN: 11 [CG]  1440 Preg Test, Ur: NEGATIVE [CG]  1440 WBC: 9.5 [CG]  1440 Hemoglobin: 13.9 [CG]  1440 Lipase: 21 [CG]  1440 Lactic Acid, Venous: 1.16 [CG]  1445 Repeat abdominal exam improved, no tenderness, no nausea, no episodes of diarrhea in ED. Patient asked to eat crackers.   [CG]    Clinical Course User Index [CG] Liberty Handy, PA-C   Suspect food poisoning, possibly flare of IBS-D.  No fever, no bloody/mucoid stools, no recent travel, no sick contacts. No personal h/o chron's or UC.  Patient did have exposure to suspicious food hours before onset of symptoms.  Patient  received IVF and zofran in ED, po challenge successful. No episodes of nausea, vomiting or diarrhea in ED. Repeat abdominal exam much improved, patient asked to eat crackers.  Vital signs reassuring. Initially tachycardic and elevated SBP which resolved after IVF.  CBC, BMP, lipase, lactic acid normal. Hcg negative. Doubt intraabdominal/pelvic emergency at this time, no further lab work or imaging indicated today.  Discussed lab work with patient, recommended discharge with conservative tx for symptoms and close f/u with PCP.  Patient is agreeable, would like to go home.  Patient considered safe for discharge. All questions and concerns addressed. Patient is aware of red flag symptoms to monitor for that would warrant immediate return to ED for further evaluation and tx.   Final Clinical Impressions(s) / ED Diagnoses   Final diagnoses:  Diarrhea in adult patient    New Prescriptions New Prescriptions   ONDANSETRON (ZOFRAN ODT) 4 MG DISINTEGRATING TABLET    Take 1 tablet (4 mg total) by mouth every 8 (eight) hours as needed for nausea or vomiting.     Liberty Handy, PA-C 05/21/16 1451    Liberty Handy, PA-C 05/21/16 1456    Courteney Randall An, MD 05/22/16 [phone]

## 2016-05-21 NOTE — Discharge Instructions (Signed)
As we discussed your lab work was normal and reassuring today.  Your vital signs improved after intravenous rehydration.    Please monitor your symptoms, a viral GI bug or food poisoning should resolve in 3-5 days at most.  Follow up with your primary care provider if your symptoms do not completely resolve.  Return to ED if you notice bloody/dark black stools, mucous in your stool, severe abdominal cramping and vomiting, or dehydration.  Monitor your diet, avoid irritating foods that may cause your IBS to flare up.

## 2016-06-29 DIAGNOSIS — F419 Anxiety disorder, unspecified: Secondary | ICD-10-CM | POA: Diagnosis not present

## 2016-06-29 DIAGNOSIS — F329 Major depressive disorder, single episode, unspecified: Secondary | ICD-10-CM | POA: Diagnosis not present

## 2016-06-29 DIAGNOSIS — Z6825 Body mass index (BMI) 25.0-25.9, adult: Secondary | ICD-10-CM | POA: Diagnosis not present

## 2016-08-25 DIAGNOSIS — Z113 Encounter for screening for infections with a predominantly sexual mode of transmission: Secondary | ICD-10-CM | POA: Diagnosis not present

## 2016-08-25 DIAGNOSIS — Z01419 Encounter for gynecological examination (general) (routine) without abnormal findings: Secondary | ICD-10-CM | POA: Diagnosis not present

## 2016-08-25 DIAGNOSIS — R87610 Atypical squamous cells of undetermined significance on cytologic smear of cervix (ASC-US): Secondary | ICD-10-CM | POA: Diagnosis not present

## 2016-08-25 DIAGNOSIS — Z6824 Body mass index (BMI) 24.0-24.9, adult: Secondary | ICD-10-CM | POA: Diagnosis not present

## 2016-09-03 ENCOUNTER — Other Ambulatory Visit: Payer: Self-pay | Admitting: Obstetrics & Gynecology

## 2016-09-03 DIAGNOSIS — R1011 Right upper quadrant pain: Secondary | ICD-10-CM

## 2016-09-09 ENCOUNTER — Ambulatory Visit
Admission: RE | Admit: 2016-09-09 | Discharge: 2016-09-09 | Disposition: A | Discharge status: Home / Self Care | Payer: BLUE CROSS/BLUE SHIELD | Source: Ambulatory Visit | Attending: Obstetrics & Gynecology | Admitting: Obstetrics & Gynecology

## 2016-09-09 DIAGNOSIS — R1011 Right upper quadrant pain: Secondary | ICD-10-CM

## 2016-11-23 DIAGNOSIS — Z6825 Body mass index (BMI) 25.0-25.9, adult: Secondary | ICD-10-CM | POA: Diagnosis not present

## 2016-11-23 DIAGNOSIS — H6692 Otitis media, unspecified, left ear: Secondary | ICD-10-CM | POA: Diagnosis not present

## 2017-03-16 DIAGNOSIS — Z1339 Encounter for screening examination for other mental health and behavioral disorders: Secondary | ICD-10-CM | POA: Diagnosis not present

## 2017-03-16 DIAGNOSIS — Z6826 Body mass index (BMI) 26.0-26.9, adult: Secondary | ICD-10-CM | POA: Diagnosis not present

## 2017-03-16 DIAGNOSIS — Z1331 Encounter for screening for depression: Secondary | ICD-10-CM | POA: Diagnosis not present

## 2017-03-16 DIAGNOSIS — Z0000 Encounter for general adult medical examination without abnormal findings: Secondary | ICD-10-CM | POA: Diagnosis not present

## 2017-03-16 DIAGNOSIS — Z13220 Encounter for screening for lipoid disorders: Secondary | ICD-10-CM | POA: Diagnosis not present

## 2017-03-22 DIAGNOSIS — N926 Irregular menstruation, unspecified: Secondary | ICD-10-CM | POA: Diagnosis not present

## 2017-03-22 DIAGNOSIS — R1084 Generalized abdominal pain: Secondary | ICD-10-CM | POA: Diagnosis not present

## 2017-03-22 DIAGNOSIS — N200 Calculus of kidney: Secondary | ICD-10-CM | POA: Diagnosis not present

## 2017-03-22 DIAGNOSIS — N201 Calculus of ureter: Secondary | ICD-10-CM | POA: Diagnosis not present

## 2017-03-22 DIAGNOSIS — R109 Unspecified abdominal pain: Secondary | ICD-10-CM | POA: Diagnosis not present

## 2017-03-22 DIAGNOSIS — N2889 Other specified disorders of kidney and ureter: Secondary | ICD-10-CM | POA: Diagnosis not present

## 2017-04-09 DIAGNOSIS — R1032 Left lower quadrant pain: Secondary | ICD-10-CM | POA: Diagnosis not present

## 2017-04-09 DIAGNOSIS — Z3200 Encounter for pregnancy test, result unknown: Secondary | ICD-10-CM | POA: Diagnosis not present

## 2017-04-09 DIAGNOSIS — N800 Endometriosis of uterus: Secondary | ICD-10-CM | POA: Diagnosis not present

## 2017-04-15 DIAGNOSIS — N809 Endometriosis, unspecified: Secondary | ICD-10-CM | POA: Diagnosis not present

## 2017-04-15 DIAGNOSIS — M6289 Other specified disorders of muscle: Secondary | ICD-10-CM | POA: Diagnosis not present

## 2017-04-15 DIAGNOSIS — R102 Pelvic and perineal pain: Secondary | ICD-10-CM | POA: Diagnosis not present

## 2017-04-15 DIAGNOSIS — K580 Irritable bowel syndrome with diarrhea: Secondary | ICD-10-CM | POA: Diagnosis not present

## 2017-04-20 DIAGNOSIS — K580 Irritable bowel syndrome with diarrhea: Secondary | ICD-10-CM | POA: Diagnosis not present

## 2017-04-21 DIAGNOSIS — K580 Irritable bowel syndrome with diarrhea: Secondary | ICD-10-CM | POA: Diagnosis not present

## 2017-04-26 DIAGNOSIS — R102 Pelvic and perineal pain: Secondary | ICD-10-CM | POA: Diagnosis not present

## 2017-04-26 DIAGNOSIS — N809 Endometriosis, unspecified: Secondary | ICD-10-CM | POA: Diagnosis not present

## 2017-05-11 DIAGNOSIS — R102 Pelvic and perineal pain: Secondary | ICD-10-CM | POA: Diagnosis not present

## 2017-05-11 DIAGNOSIS — N3941 Urge incontinence: Secondary | ICD-10-CM | POA: Diagnosis not present

## 2017-05-20 DIAGNOSIS — M6289 Other specified disorders of muscle: Secondary | ICD-10-CM | POA: Diagnosis not present

## 2017-05-20 DIAGNOSIS — R102 Pelvic and perineal pain: Secondary | ICD-10-CM | POA: Diagnosis not present

## 2017-05-20 DIAGNOSIS — K580 Irritable bowel syndrome with diarrhea: Secondary | ICD-10-CM | POA: Diagnosis not present

## 2017-05-20 DIAGNOSIS — N809 Endometriosis, unspecified: Secondary | ICD-10-CM | POA: Diagnosis not present

## 2017-05-31 DIAGNOSIS — N3946 Mixed incontinence: Secondary | ICD-10-CM | POA: Diagnosis not present

## 2017-05-31 DIAGNOSIS — R102 Pelvic and perineal pain: Secondary | ICD-10-CM | POA: Diagnosis not present

## 2017-05-31 DIAGNOSIS — N8189 Other female genital prolapse: Secondary | ICD-10-CM | POA: Diagnosis not present

## 2017-06-16 DIAGNOSIS — R102 Pelvic and perineal pain: Secondary | ICD-10-CM | POA: Diagnosis not present

## 2017-06-16 DIAGNOSIS — R32 Unspecified urinary incontinence: Secondary | ICD-10-CM | POA: Diagnosis not present

## 2017-06-16 DIAGNOSIS — K589 Irritable bowel syndrome without diarrhea: Secondary | ICD-10-CM | POA: Diagnosis not present

## 2017-06-16 DIAGNOSIS — N8189 Other female genital prolapse: Secondary | ICD-10-CM | POA: Diagnosis not present

## 2017-07-15 DIAGNOSIS — K580 Irritable bowel syndrome with diarrhea: Secondary | ICD-10-CM | POA: Diagnosis not present

## 2017-07-15 DIAGNOSIS — M6289 Other specified disorders of muscle: Secondary | ICD-10-CM | POA: Diagnosis not present

## 2017-07-15 DIAGNOSIS — N809 Endometriosis, unspecified: Secondary | ICD-10-CM | POA: Diagnosis not present

## 2017-07-15 DIAGNOSIS — R102 Pelvic and perineal pain: Secondary | ICD-10-CM | POA: Diagnosis not present

## 2017-07-19 DIAGNOSIS — R3915 Urgency of urination: Secondary | ICD-10-CM | POA: Diagnosis not present

## 2017-07-19 DIAGNOSIS — N8184 Pelvic muscle wasting: Secondary | ICD-10-CM | POA: Diagnosis not present

## 2017-07-19 DIAGNOSIS — R32 Unspecified urinary incontinence: Secondary | ICD-10-CM | POA: Diagnosis not present

## 2017-07-19 DIAGNOSIS — R102 Pelvic and perineal pain: Secondary | ICD-10-CM | POA: Diagnosis not present

## 2017-08-02 DIAGNOSIS — R12 Heartburn: Secondary | ICD-10-CM | POA: Diagnosis not present

## 2017-08-02 DIAGNOSIS — K219 Gastro-esophageal reflux disease without esophagitis: Secondary | ICD-10-CM | POA: Diagnosis not present

## 2017-08-11 DIAGNOSIS — R32 Unspecified urinary incontinence: Secondary | ICD-10-CM | POA: Diagnosis not present

## 2017-08-11 DIAGNOSIS — R102 Pelvic and perineal pain: Secondary | ICD-10-CM | POA: Diagnosis not present

## 2017-08-11 DIAGNOSIS — N8184 Pelvic muscle wasting: Secondary | ICD-10-CM | POA: Diagnosis not present

## 2017-08-11 DIAGNOSIS — R3915 Urgency of urination: Secondary | ICD-10-CM | POA: Diagnosis not present

## 2017-08-27 DIAGNOSIS — Z6828 Body mass index (BMI) 28.0-28.9, adult: Secondary | ICD-10-CM | POA: Diagnosis not present

## 2017-08-27 DIAGNOSIS — F329 Major depressive disorder, single episode, unspecified: Secondary | ICD-10-CM | POA: Diagnosis not present

## 2017-08-27 DIAGNOSIS — F419 Anxiety disorder, unspecified: Secondary | ICD-10-CM | POA: Diagnosis not present

## 2017-10-19 DIAGNOSIS — N8184 Pelvic muscle wasting: Secondary | ICD-10-CM | POA: Diagnosis not present

## 2017-10-19 DIAGNOSIS — R102 Pelvic and perineal pain: Secondary | ICD-10-CM | POA: Diagnosis not present

## 2017-10-19 DIAGNOSIS — R3915 Urgency of urination: Secondary | ICD-10-CM | POA: Diagnosis not present

## 2017-10-19 DIAGNOSIS — K580 Irritable bowel syndrome with diarrhea: Secondary | ICD-10-CM | POA: Diagnosis not present

## 2017-10-21 DIAGNOSIS — R102 Pelvic and perineal pain: Secondary | ICD-10-CM | POA: Diagnosis not present

## 2017-10-21 DIAGNOSIS — M6289 Other specified disorders of muscle: Secondary | ICD-10-CM | POA: Diagnosis not present

## 2017-10-21 DIAGNOSIS — N809 Endometriosis, unspecified: Secondary | ICD-10-CM | POA: Diagnosis not present

## 2017-11-09 DIAGNOSIS — R1031 Right lower quadrant pain: Secondary | ICD-10-CM | POA: Diagnosis not present

## 2017-11-09 DIAGNOSIS — K219 Gastro-esophageal reflux disease without esophagitis: Secondary | ICD-10-CM | POA: Diagnosis not present

## 2017-11-18 DIAGNOSIS — R350 Frequency of micturition: Secondary | ICD-10-CM | POA: Diagnosis not present

## 2017-11-18 DIAGNOSIS — R102 Pelvic and perineal pain: Secondary | ICD-10-CM | POA: Diagnosis not present

## 2017-11-18 DIAGNOSIS — R3915 Urgency of urination: Secondary | ICD-10-CM | POA: Diagnosis not present

## 2017-11-18 DIAGNOSIS — N8184 Pelvic muscle wasting: Secondary | ICD-10-CM | POA: Diagnosis not present

## 2017-12-28 DIAGNOSIS — R350 Frequency of micturition: Secondary | ICD-10-CM | POA: Diagnosis not present

## 2017-12-28 DIAGNOSIS — N8184 Pelvic muscle wasting: Secondary | ICD-10-CM | POA: Diagnosis not present

## 2017-12-28 DIAGNOSIS — R3915 Urgency of urination: Secondary | ICD-10-CM | POA: Diagnosis not present

## 2017-12-28 DIAGNOSIS — R32 Unspecified urinary incontinence: Secondary | ICD-10-CM | POA: Diagnosis not present

## 2018-01-12 DIAGNOSIS — R1031 Right lower quadrant pain: Secondary | ICD-10-CM | POA: Diagnosis not present

## 2018-01-12 DIAGNOSIS — K219 Gastro-esophageal reflux disease without esophagitis: Secondary | ICD-10-CM | POA: Diagnosis not present

## 2018-01-19 DIAGNOSIS — J00 Acute nasopharyngitis [common cold]: Secondary | ICD-10-CM | POA: Diagnosis not present

## 2018-01-19 DIAGNOSIS — R0602 Shortness of breath: Secondary | ICD-10-CM | POA: Diagnosis not present

## 2018-01-19 DIAGNOSIS — R0982 Postnasal drip: Secondary | ICD-10-CM | POA: Diagnosis not present

## 2018-01-19 DIAGNOSIS — J018 Other acute sinusitis: Secondary | ICD-10-CM | POA: Diagnosis not present

## 2018-01-26 DIAGNOSIS — R0602 Shortness of breath: Secondary | ICD-10-CM | POA: Diagnosis not present

## 2018-01-26 DIAGNOSIS — J00 Acute nasopharyngitis [common cold]: Secondary | ICD-10-CM | POA: Diagnosis not present

## 2018-01-26 DIAGNOSIS — J018 Other acute sinusitis: Secondary | ICD-10-CM | POA: Diagnosis not present

## 2018-01-26 DIAGNOSIS — R0982 Postnasal drip: Secondary | ICD-10-CM | POA: Diagnosis not present

## 2018-02-02 DIAGNOSIS — K580 Irritable bowel syndrome with diarrhea: Secondary | ICD-10-CM | POA: Diagnosis not present

## 2018-02-02 DIAGNOSIS — R102 Pelvic and perineal pain: Secondary | ICD-10-CM | POA: Diagnosis not present

## 2018-02-02 DIAGNOSIS — N809 Endometriosis, unspecified: Secondary | ICD-10-CM | POA: Diagnosis not present

## 2018-02-02 DIAGNOSIS — M6289 Other specified disorders of muscle: Secondary | ICD-10-CM | POA: Diagnosis not present

## 2018-02-14 DIAGNOSIS — J209 Acute bronchitis, unspecified: Secondary | ICD-10-CM | POA: Diagnosis not present

## 2018-02-21 DIAGNOSIS — B9689 Other specified bacterial agents as the cause of diseases classified elsewhere: Secondary | ICD-10-CM | POA: Diagnosis not present

## 2018-02-21 DIAGNOSIS — J019 Acute sinusitis, unspecified: Secondary | ICD-10-CM | POA: Diagnosis not present

## 2018-03-17 DIAGNOSIS — Z13220 Encounter for screening for lipoid disorders: Secondary | ICD-10-CM | POA: Diagnosis not present

## 2018-03-17 DIAGNOSIS — Z0000 Encounter for general adult medical examination without abnormal findings: Secondary | ICD-10-CM | POA: Diagnosis not present

## 2018-03-17 DIAGNOSIS — Z6829 Body mass index (BMI) 29.0-29.9, adult: Secondary | ICD-10-CM | POA: Diagnosis not present

## 2018-04-15 DIAGNOSIS — Z6830 Body mass index (BMI) 30.0-30.9, adult: Secondary | ICD-10-CM | POA: Diagnosis not present

## 2018-04-15 DIAGNOSIS — Z01419 Encounter for gynecological examination (general) (routine) without abnormal findings: Secondary | ICD-10-CM | POA: Diagnosis not present

## 2018-04-15 DIAGNOSIS — R87610 Atypical squamous cells of undetermined significance on cytologic smear of cervix (ASC-US): Secondary | ICD-10-CM | POA: Diagnosis not present

## 2018-08-09 DIAGNOSIS — K580 Irritable bowel syndrome with diarrhea: Secondary | ICD-10-CM | POA: Diagnosis not present

## 2018-08-09 DIAGNOSIS — R1031 Right lower quadrant pain: Secondary | ICD-10-CM | POA: Diagnosis not present

## 2018-09-04 IMAGING — CT CT ABD-PELV W/ CM
2 of 4 series · 15 of 46 positions shown, 17 images · IV contrast (APPLIED)
Comparison: None.

CLINICAL DATA: Nausea vomiting and diarrhea. Dysuria, left lower
quadrant pain and fever

EXAM:
CT ABDOMEN AND PELVIS WITH CONTRAST
TECHNIQUE: Multidetector CT imaging of the abdomen and pelvis was performed
using the standard protocol following bolus administration of
intravenous contrast.
CONTRAST:  100mL [id number] IOPAMIDOL ([id number]) INJECTION 61%

[Series 2: axial st · axial · 0.93mm/px · z∈[+770,+1166]mm · 12 of 87 slices shown, 14 images]
[im 4/87  soft-tissue]
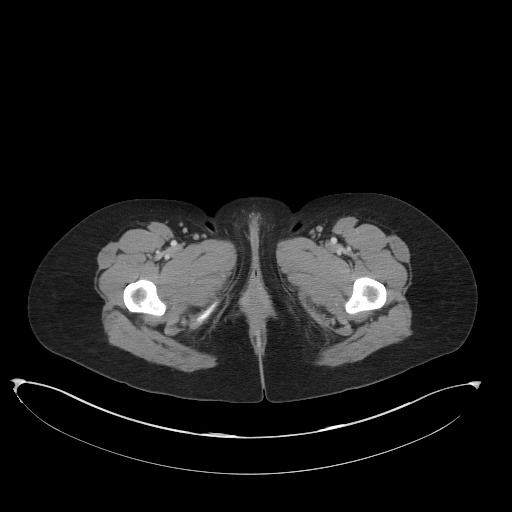
[im 4/87  bone]
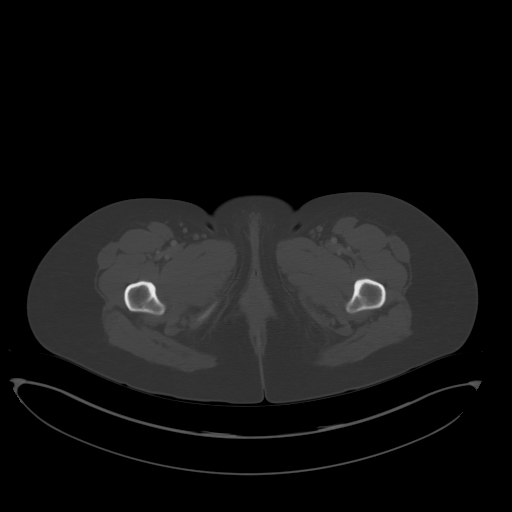
[im 12/87  soft-tissue]
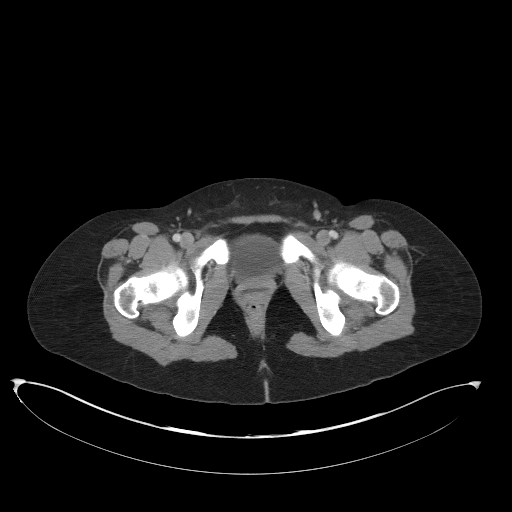
[im 19/87  soft-tissue]
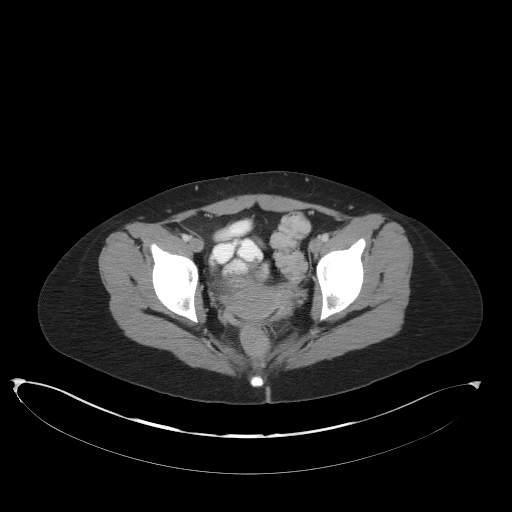
[im 27/87  soft-tissue]
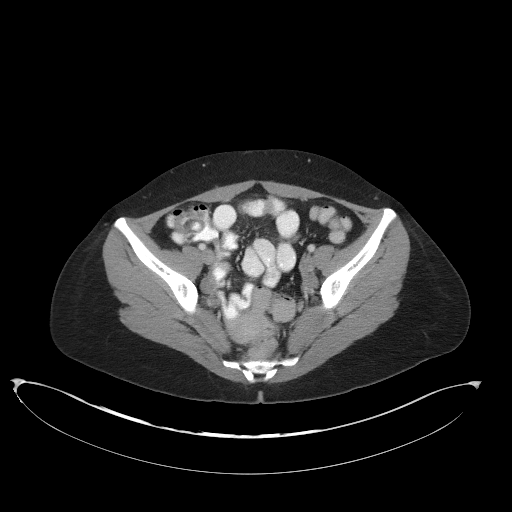
[im 34/87  soft-tissue]
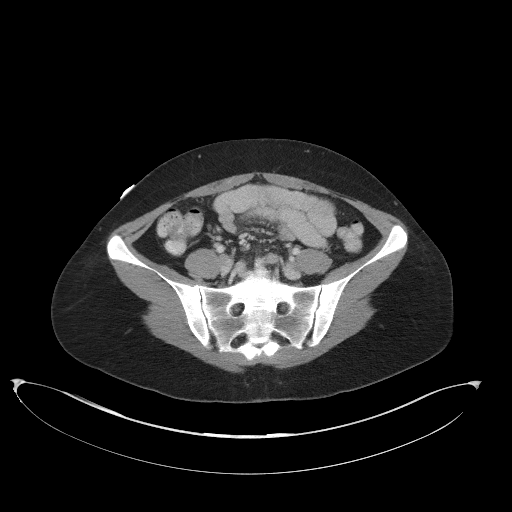
[im 42/87  soft-tissue]
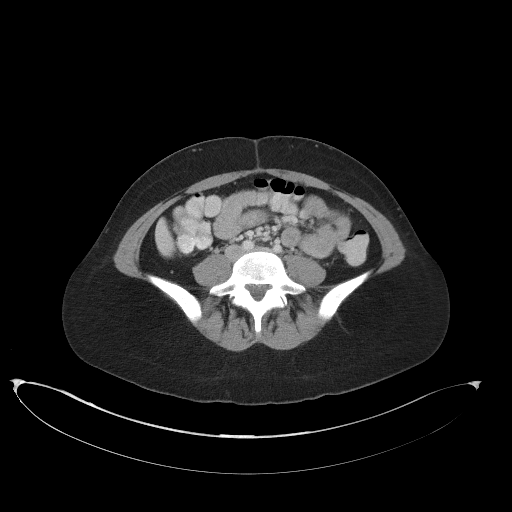
[im 45/87  soft-tissue]
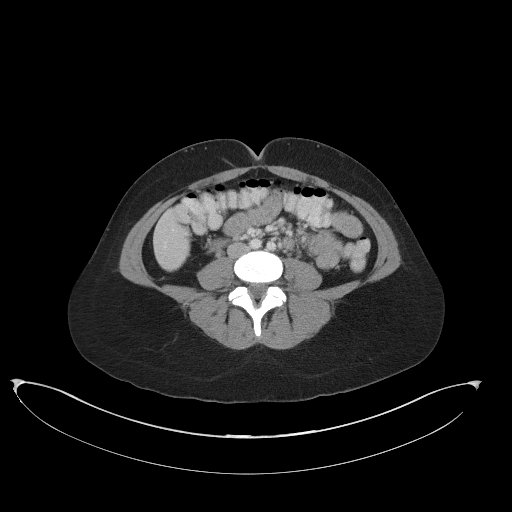
[im 53/87  soft-tissue]
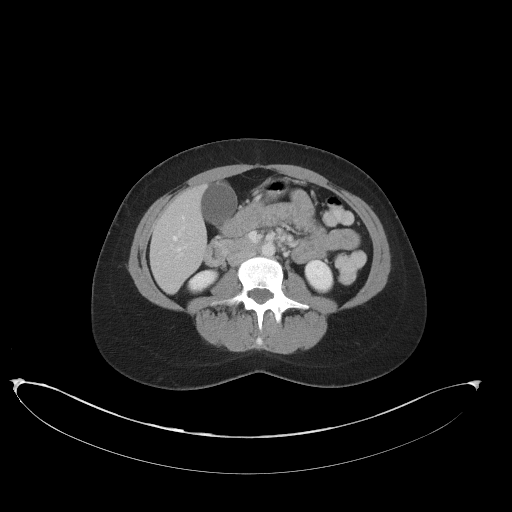
[im 60/87  soft-tissue]
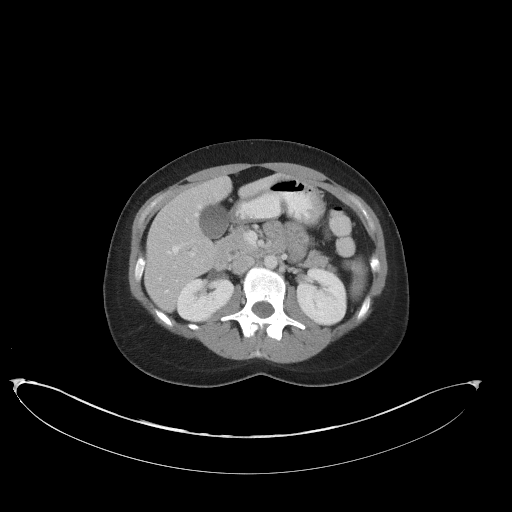
[im 60/87  bone]
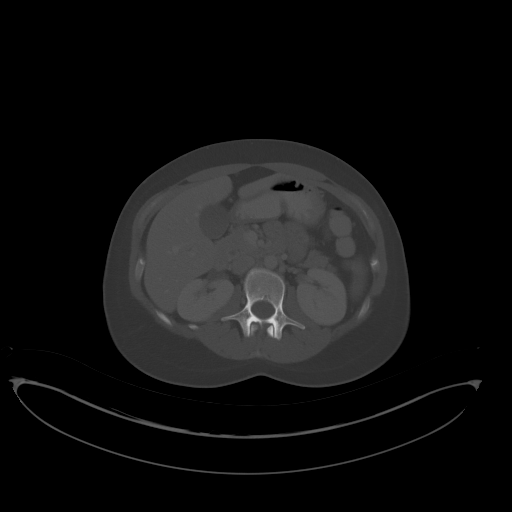
[im 68/87  soft-tissue]
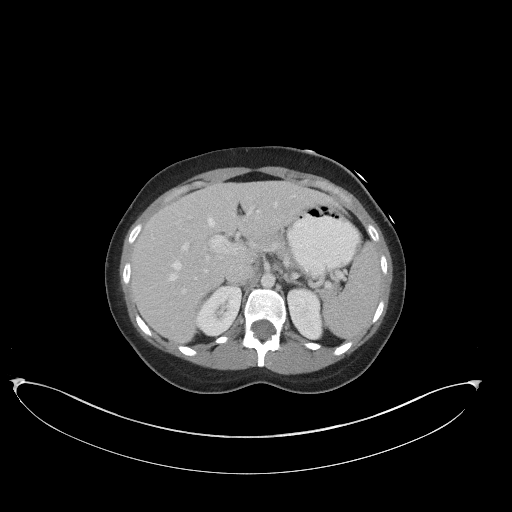
[im 75/87  soft-tissue]
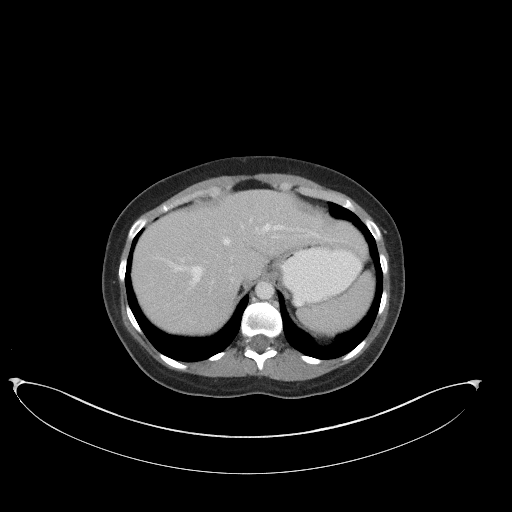
[im 83/87  soft-tissue]
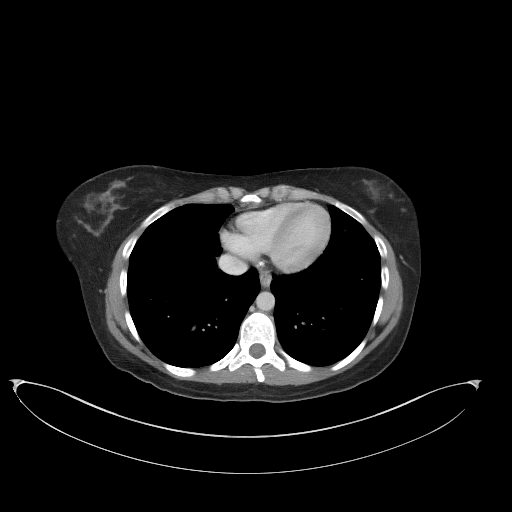

[Series 5: coronal st · coronal · 0.78mm/px · 3 of 74 slices shown]
[im 25/74  soft-tissue]
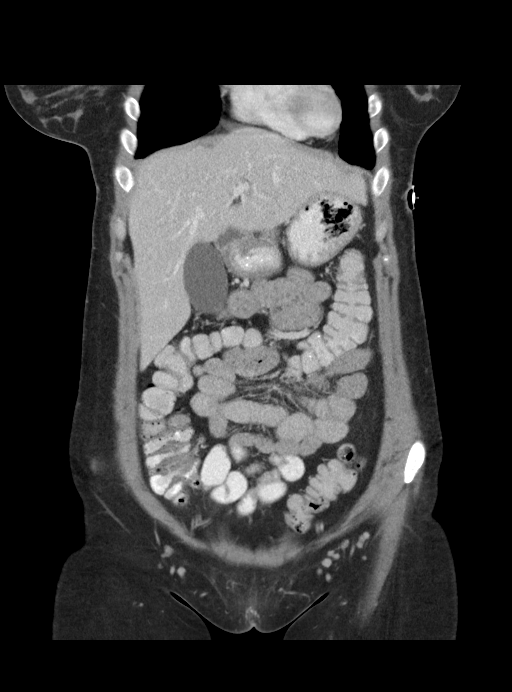
[im 33/74  soft-tissue]
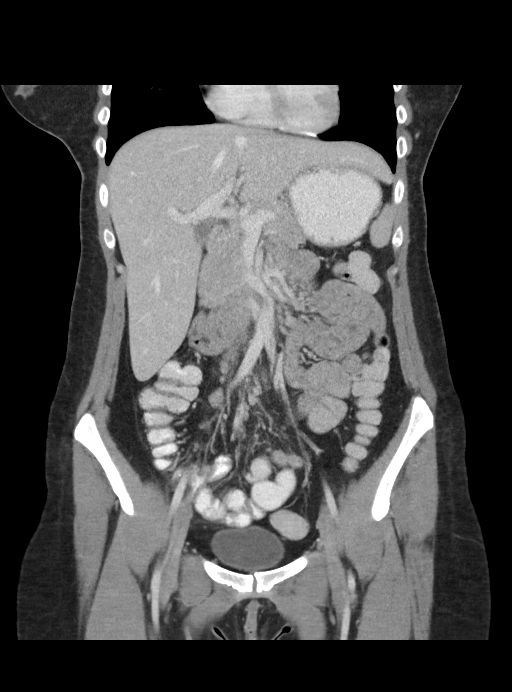
[im 41/74  soft-tissue]
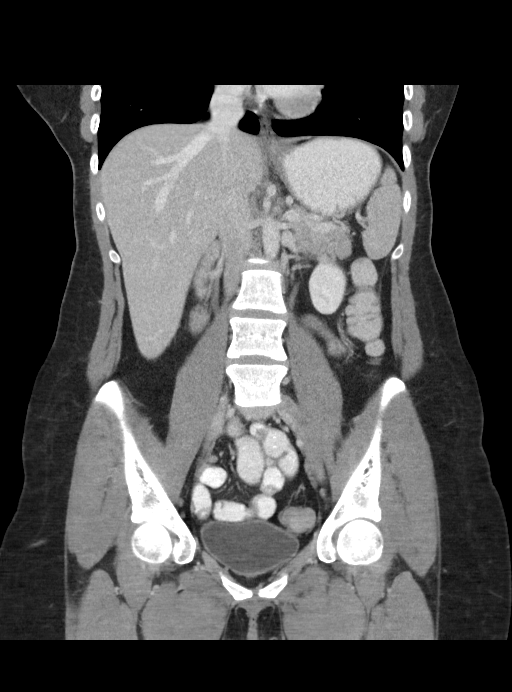

[15 of 46 positions shown; findings below may reference images not displayed]

FINDINGS: Lower chest: Lung bases clear and without acute infiltrate or
effusion. Normal heart size.

Hepatobiliary: The liver size is enlarged, measuring 20 cm in
craniocaudad dimension. No focal hepatic abnormality is visualized.
Gallbladder contains no calcified stones. A small fold is present at
the gallbladder fundus. There is no intra or extrahepatic biliary
dilatation.

Pancreas: Unremarkable. No pancreatic ductal dilatation or
surrounding inflammatory changes.

Spleen: Normal in size without focal abnormality.

Adrenals/Urinary Tract: Bilateral adrenal glands appear within
normal limits. Kidneys show no focal masses. No hydronephrosis.
There are bilateral nonobstructing kidney stones, a stone within the
he lower pole of the right kidney measures 4 mm in size. Punctate
stones are present in the mid left kidney. Urinary bladder is
unremarkable.

Stomach/Bowel: Stomach is nonenlarged. There is no evidence of a
bowel obstruction. Questionable wall thickening of jejunal small
bowel loops. Contrast is present in the colon. Appendix is
visualized on coronal images and is within normal limits.

Vascular/Lymphatic: No significant vascular findings are present. No
enlarged abdominal or pelvic lymph nodes.

Reproductive: Uterus and bilateral adnexa are unremarkable.

Other: No free air.  No free fluid.

Musculoskeletal: No acute or significant osseous findings.
IMPRESSION: 1. No evidence for bowel obstruction. Suggestion of mild bowel wall
thickening of jejunal small bowel loops, findings could be secondary
to infectious or inflammatory enteritis.
2. Hepatomegaly
3. Nonobstructing bilateral kidney stones

## 2018-10-05 DIAGNOSIS — N809 Endometriosis, unspecified: Secondary | ICD-10-CM | POA: Diagnosis not present

## 2018-10-05 DIAGNOSIS — M6289 Other specified disorders of muscle: Secondary | ICD-10-CM | POA: Diagnosis not present

## 2018-10-05 DIAGNOSIS — R102 Pelvic and perineal pain: Secondary | ICD-10-CM | POA: Diagnosis not present

## 2018-10-10 DIAGNOSIS — N809 Endometriosis, unspecified: Secondary | ICD-10-CM | POA: Diagnosis not present

## 2018-10-10 DIAGNOSIS — R102 Pelvic and perineal pain: Secondary | ICD-10-CM | POA: Diagnosis not present

## 2018-11-02 DIAGNOSIS — M6289 Other specified disorders of muscle: Secondary | ICD-10-CM | POA: Diagnosis not present

## 2018-11-02 DIAGNOSIS — R102 Pelvic and perineal pain: Secondary | ICD-10-CM | POA: Diagnosis not present

## 2018-11-02 DIAGNOSIS — N809 Endometriosis, unspecified: Secondary | ICD-10-CM | POA: Diagnosis not present

## 2018-11-08 IMAGING — US US ABDOMEN COMPLETE
1 series · 14 of 25 positions shown · non-contrast
Comparison: CT scan abdomen dated 11/28/2015

CLINICAL DATA: Right upper quadrant abdominal pain.

EXAM:
ABDOMEN ULTRASOUND COMPLETE

[Series 1: us abdomen complete · 0.17mm/px · 14 of 94 slices shown]
[im 1/94]
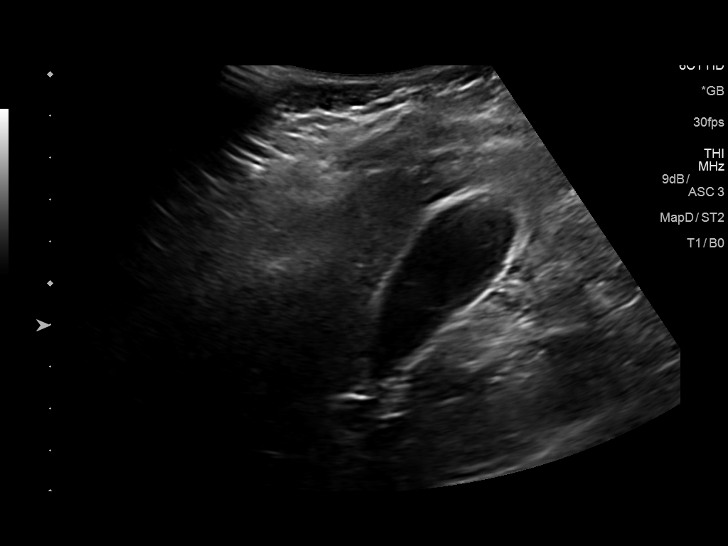
[im 8/94]
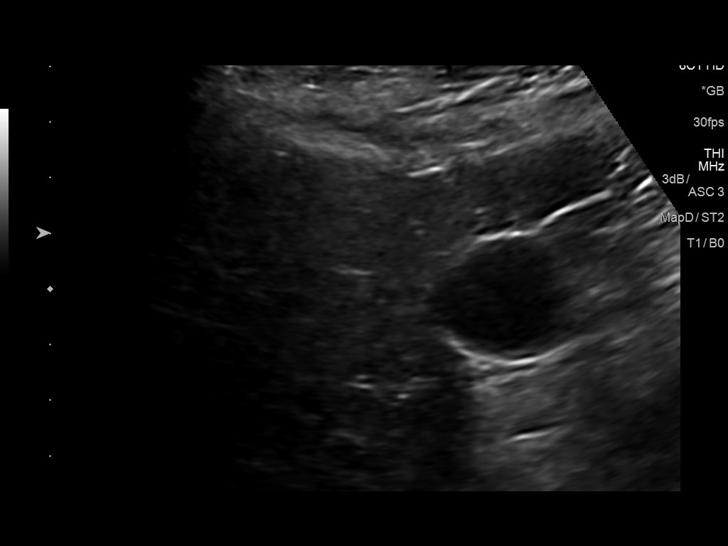
[im 16/94]
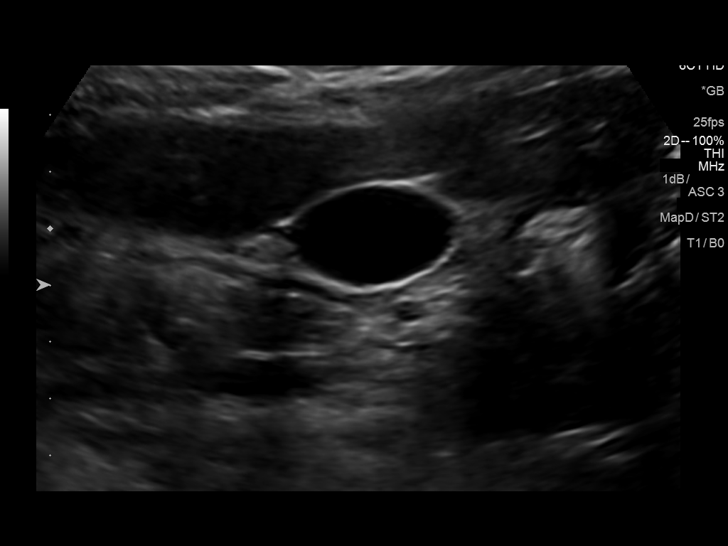
[im 24/94]
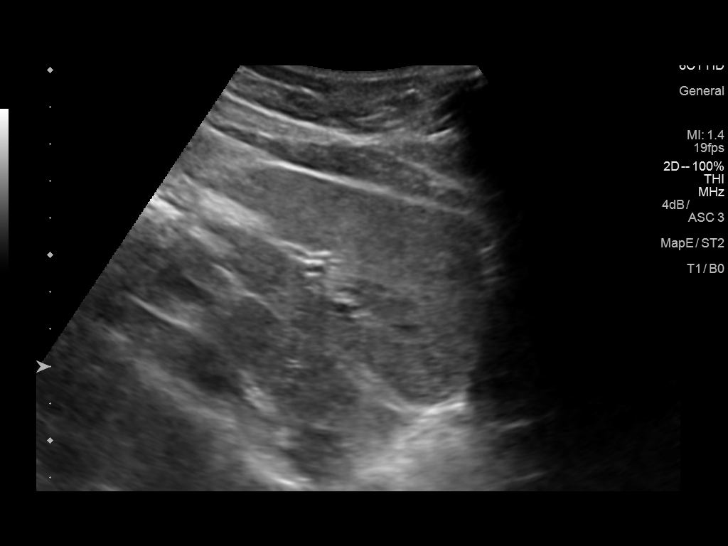
[im 32/94]
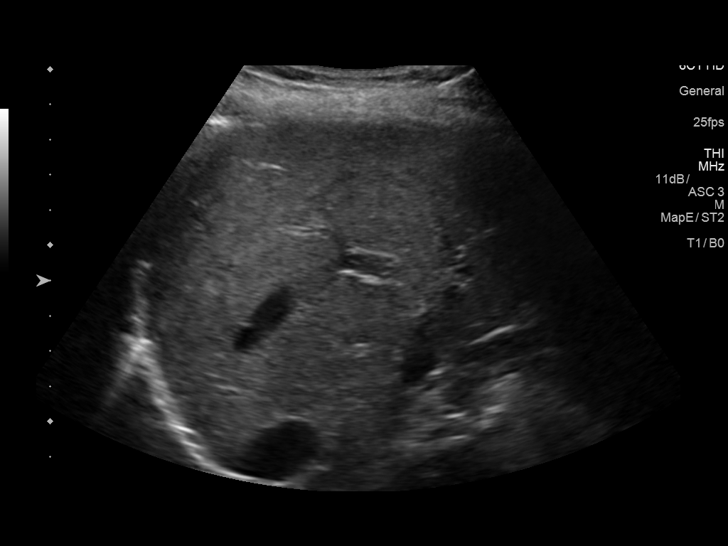
[im 35/94]
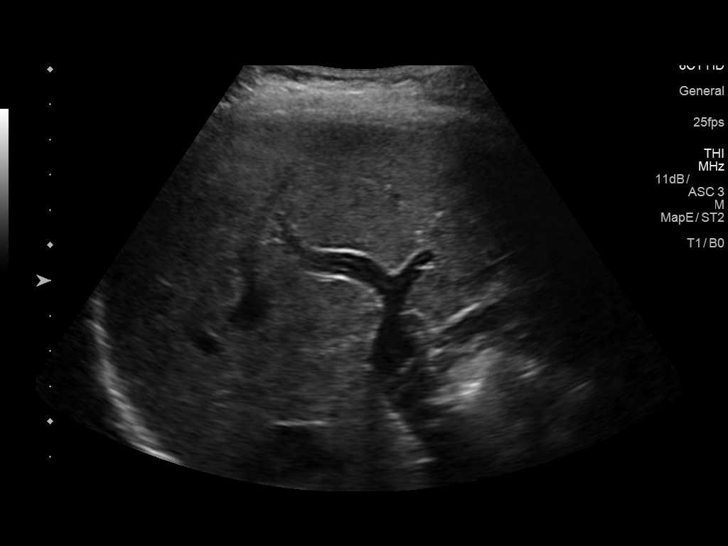
[im 43/94]
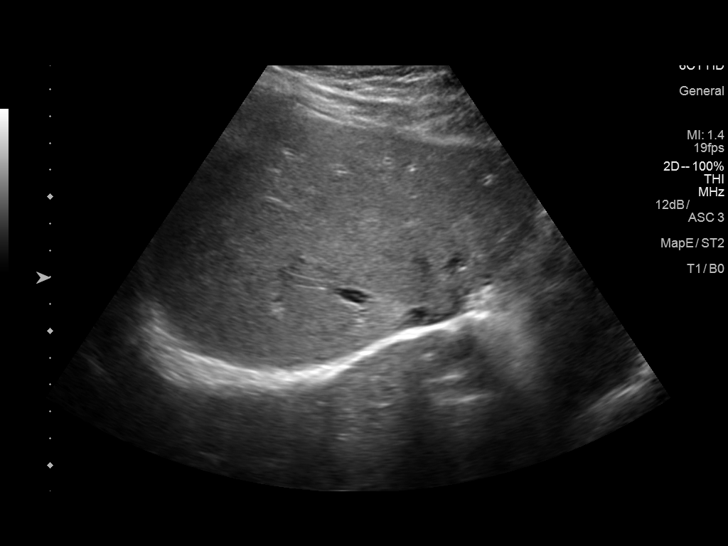
[im 51/94]
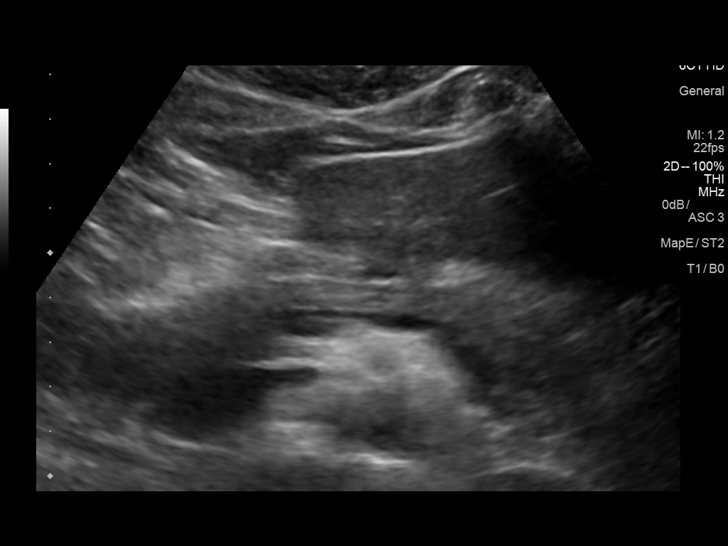
[im 59/94]
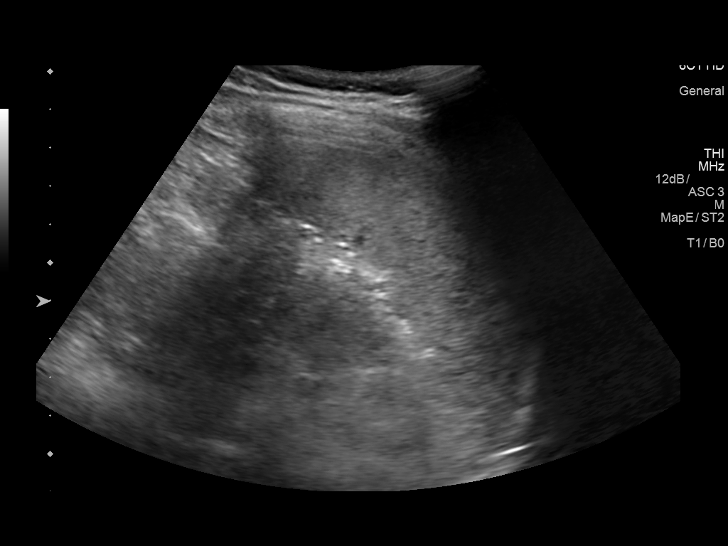
[im 63/94]
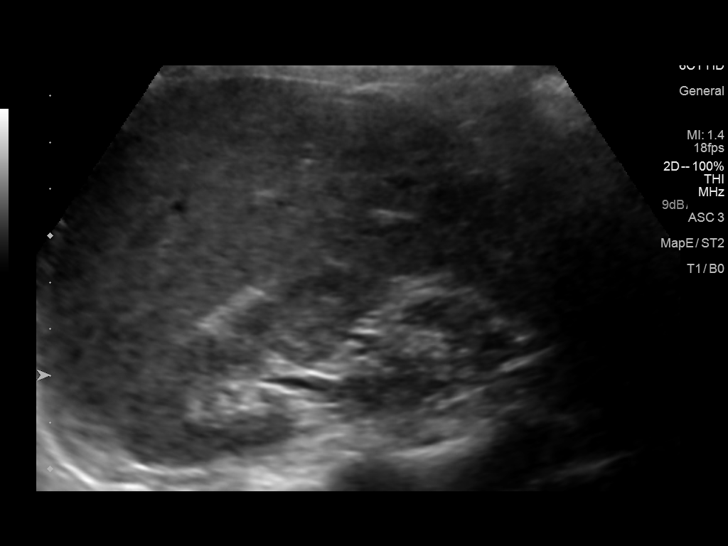
[im 70/94]
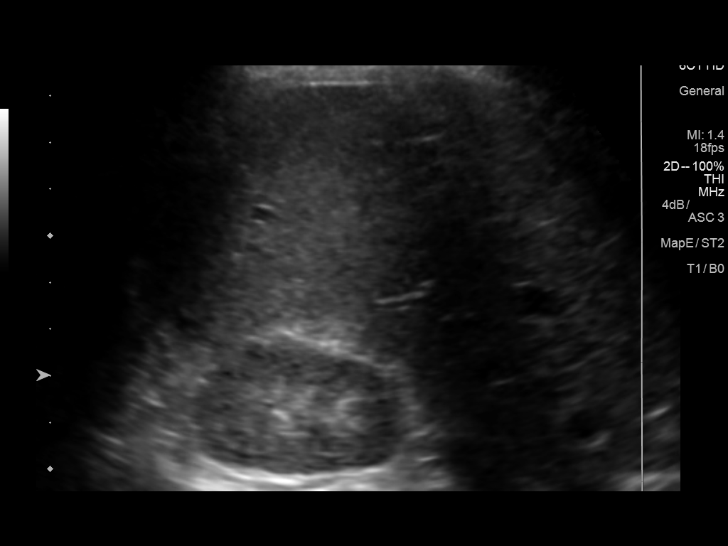
[im 78/94]
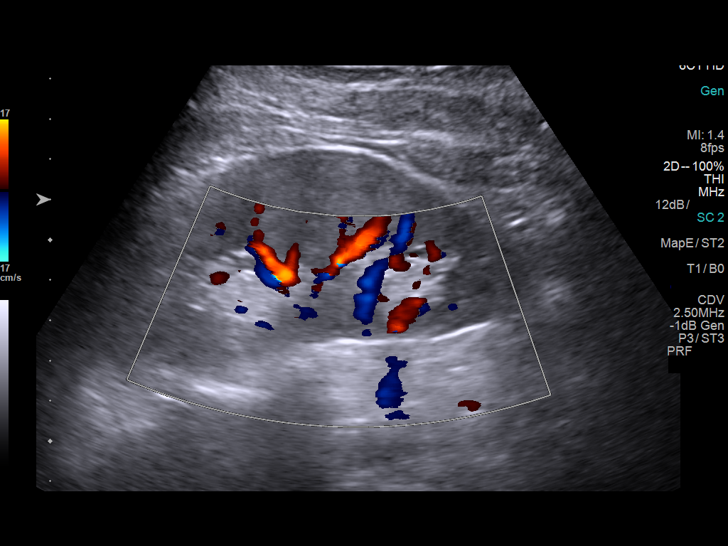
[im 86/94]
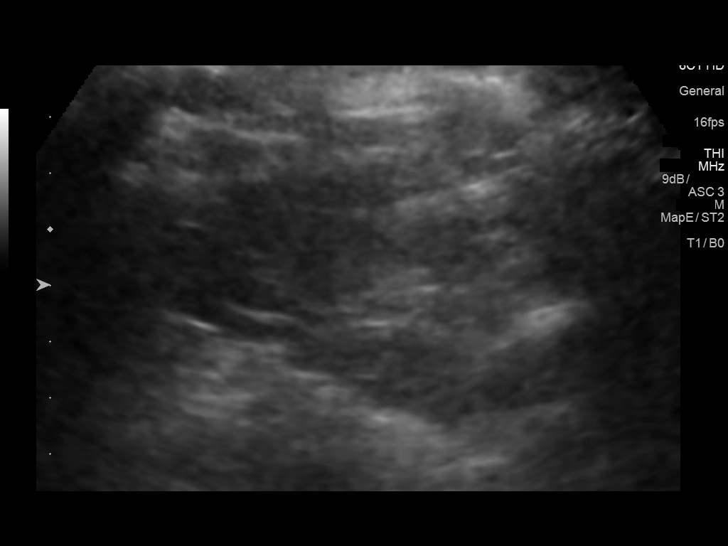
[im 94/94]
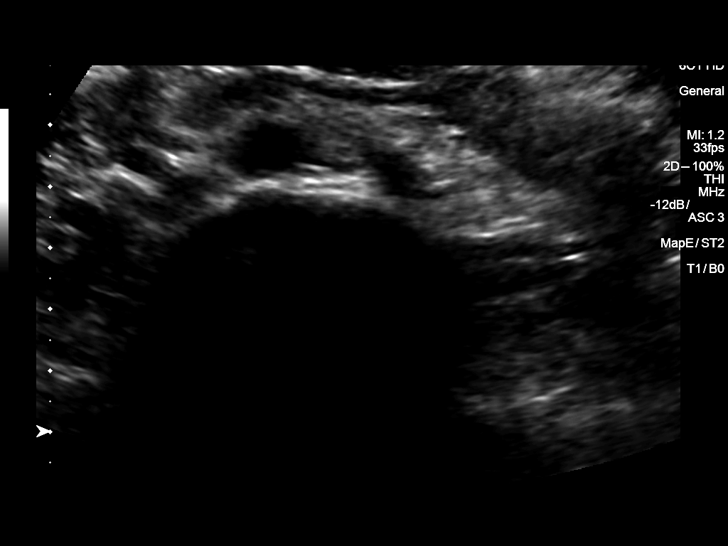

[14 of 25 positions shown; findings below may reference images not displayed]

FINDINGS: Gallbladder: No gallstones or wall thickening visualized. No
sonographic Murphy sign noted by sonographer.

Common bile duct: Diameter: 3 mm, normal.

Liver: No focal lesion identified. Within normal limits in
parenchymal echogenicity.

IVC: No abnormality visualized.

Pancreas: Visualized portion unremarkable.

Spleen: Size and appearance within normal limits.

Right Kidney: Length: 9.6 cm. Echogenicity within normal limits. No
mass or hydronephrosis visualized.

Left Kidney: Length: 11.2 cm. Echogenicity within normal limits. No
mass or hydronephrosis visualized.

Abdominal aorta: No aneurysm visualized.

Other findings: None.
IMPRESSION: Normal exam.

## 2018-12-05 DIAGNOSIS — N926 Irregular menstruation, unspecified: Secondary | ICD-10-CM | POA: Diagnosis not present

## 2018-12-05 DIAGNOSIS — N809 Endometriosis, unspecified: Secondary | ICD-10-CM | POA: Diagnosis not present

## 2018-12-05 DIAGNOSIS — Q5122 Other partial doubling of uterus: Secondary | ICD-10-CM | POA: Diagnosis not present

## 2018-12-05 DIAGNOSIS — R102 Pelvic and perineal pain: Secondary | ICD-10-CM | POA: Diagnosis not present

## 2018-12-05 DIAGNOSIS — N83291 Other ovarian cyst, right side: Secondary | ICD-10-CM | POA: Diagnosis not present

## 2018-12-05 DIAGNOSIS — N939 Abnormal uterine and vaginal bleeding, unspecified: Secondary | ICD-10-CM | POA: Diagnosis not present

## 2019-01-09 DIAGNOSIS — N62 Hypertrophy of breast: Secondary | ICD-10-CM | POA: Diagnosis not present

## 2019-01-19 ENCOUNTER — Institutional Professional Consult (permissible substitution): Payer: BLUE CROSS/BLUE SHIELD | Admitting: Plastic Surgery

## 2019-01-19 DIAGNOSIS — Z20828 Contact with and (suspected) exposure to other viral communicable diseases: Secondary | ICD-10-CM | POA: Diagnosis not present

## 2019-01-24 DIAGNOSIS — Z20828 Contact with and (suspected) exposure to other viral communicable diseases: Secondary | ICD-10-CM | POA: Diagnosis not present

## 2019-02-06 DIAGNOSIS — Z20828 Contact with and (suspected) exposure to other viral communicable diseases: Secondary | ICD-10-CM | POA: Diagnosis not present

## 2019-02-13 DIAGNOSIS — Z20828 Contact with and (suspected) exposure to other viral communicable diseases: Secondary | ICD-10-CM | POA: Diagnosis not present

## 2019-02-13 DIAGNOSIS — J320 Chronic maxillary sinusitis: Secondary | ICD-10-CM | POA: Diagnosis not present

## 2019-02-17 NOTE — L&D Delivery Note (Signed)
Operative Delivery Note At 12:21 AM a viable female was delivered via Vaginal, Vacuum Investment banker, operational).  Presentation: vertex; Position: Left,, Occiput,, Anterior; Station: +3.   Patient had been effectively pushing for 3.5 hours. Recurrent late decelerations on tracing. Although FHR would recover with maternal repositioning with moderate variability and accelerations in between contractions. Discussed with patient given duration of pushing and increasing maternal exhaustion, would recommend expediting delivery at this time. Patient counseled on risks as noted below and agreed to proceed with VAVD.   Verbal consent: obtained from patient.  Risks and benefits discussed in detail.  Risks include, but are not limited to the risks of anesthesia, bleeding, infection, damage to maternal tissues, fetal cephalhematoma.  There is also the risk of inability to effect vaginal delivery of the head, or shoulder dystocia that cannot be resolved by established maneuvers, leading to the need for emergency cesarean section.  APGAR: 9, 9; weight pending for skin to skin .   Placenta status: spontaneous, intact, to LD .   Cord:  with the following complications: .  Cord pH: n/a  Anesthesia:  epidural Instruments: Kiwi Vacuum: Applied to the flexion point approximately 2 cm anterior to posterior fontanelle. Pressure increased to 500 mmHg. Delivery of the head with 1 pull. Anterior shoulder subsequently delivered without difficulties. NICU present for delivery. Episiotomy: None Lacerations: 2nd degree, right vaginal 1st degree Suture Repair: 3.0 vicryl Est. Blood Loss (mL):  QBL 374  Mom to postpartum.  Baby to Couplet care / Skin to Skin.  Gerlean Cid A Khaleah Duer 02/05/2020, 1:03 AM

## 2019-02-21 DIAGNOSIS — Z20828 Contact with and (suspected) exposure to other viral communicable diseases: Secondary | ICD-10-CM | POA: Diagnosis not present

## 2019-02-28 DIAGNOSIS — Z20828 Contact with and (suspected) exposure to other viral communicable diseases: Secondary | ICD-10-CM | POA: Diagnosis not present

## 2019-03-06 DIAGNOSIS — R0989 Other specified symptoms and signs involving the circulatory and respiratory systems: Secondary | ICD-10-CM | POA: Diagnosis not present

## 2019-03-06 DIAGNOSIS — Z20828 Contact with and (suspected) exposure to other viral communicable diseases: Secondary | ICD-10-CM | POA: Diagnosis not present

## 2019-03-06 DIAGNOSIS — U071 COVID-19: Secondary | ICD-10-CM | POA: Diagnosis not present

## 2019-04-11 DIAGNOSIS — Z0000 Encounter for general adult medical examination without abnormal findings: Secondary | ICD-10-CM | POA: Diagnosis not present

## 2019-04-11 DIAGNOSIS — Z13220 Encounter for screening for lipoid disorders: Secondary | ICD-10-CM | POA: Diagnosis not present

## 2019-04-17 DIAGNOSIS — Z1331 Encounter for screening for depression: Secondary | ICD-10-CM | POA: Diagnosis not present

## 2019-04-17 DIAGNOSIS — Z0000 Encounter for general adult medical examination without abnormal findings: Secondary | ICD-10-CM | POA: Diagnosis not present

## 2019-04-17 DIAGNOSIS — Z6827 Body mass index (BMI) 27.0-27.9, adult: Secondary | ICD-10-CM | POA: Diagnosis not present

## 2019-05-02 DIAGNOSIS — G43909 Migraine, unspecified, not intractable, without status migrainosus: Secondary | ICD-10-CM | POA: Diagnosis not present

## 2019-05-02 DIAGNOSIS — Z6827 Body mass index (BMI) 27.0-27.9, adult: Secondary | ICD-10-CM | POA: Diagnosis not present

## 2019-06-21 DIAGNOSIS — Z3689 Encounter for other specified antenatal screening: Secondary | ICD-10-CM | POA: Diagnosis not present

## 2019-07-19 LAB — OB RESULTS CONSOLE HIV ANTIBODY (ROUTINE TESTING): HIV: NONREACTIVE

## 2019-07-19 LAB — OB RESULTS CONSOLE RPR: RPR: NONREACTIVE

## 2019-07-19 LAB — OB RESULTS CONSOLE ABO/RH: RH Type: NEGATIVE

## 2019-07-19 LAB — OB RESULTS CONSOLE HEPATITIS B SURFACE ANTIGEN: Hepatitis B Surface Ag: NEGATIVE

## 2019-07-19 LAB — OB RESULTS CONSOLE ANTIBODY SCREEN: Antibody Screen: NEGATIVE

## 2019-07-19 LAB — OB RESULTS CONSOLE RUBELLA ANTIBODY, IGM: Rubella: IMMUNE

## 2020-01-17 LAB — OB RESULTS CONSOLE GBS: GBS: NEGATIVE

## 2020-02-01 ENCOUNTER — Other Ambulatory Visit: Payer: Self-pay | Admitting: Obstetrics & Gynecology

## 2020-02-02 ENCOUNTER — Telehealth (HOSPITAL_COMMUNITY): Payer: Self-pay | Admitting: *Deleted

## 2020-02-02 ENCOUNTER — Encounter (HOSPITAL_COMMUNITY): Payer: Self-pay | Admitting: *Deleted

## 2020-02-02 NOTE — Telephone Encounter (Signed)
Preadmission screen  

## 2020-02-03 ENCOUNTER — Inpatient Hospital Stay (HOSPITAL_COMMUNITY)
Admission: AD | Admit: 2020-02-03 | Discharge: 2020-02-07 | DRG: 806 | Disposition: A | Discharge status: Home / Self Care | Payer: 59 | Attending: Obstetrics & Gynecology | Admitting: Obstetrics & Gynecology

## 2020-02-03 ENCOUNTER — Inpatient Hospital Stay (HOSPITAL_COMMUNITY): Payer: 59

## 2020-02-03 DIAGNOSIS — Z3A39 39 weeks gestation of pregnancy: Secondary | ICD-10-CM | POA: Diagnosis not present

## 2020-02-03 DIAGNOSIS — A6000 Herpesviral infection of urogenital system, unspecified: Secondary | ICD-10-CM | POA: Diagnosis present

## 2020-02-03 DIAGNOSIS — Z6791 Unspecified blood type, Rh negative: Secondary | ICD-10-CM

## 2020-02-03 DIAGNOSIS — O9832 Other infections with a predominantly sexual mode of transmission complicating childbirth: Secondary | ICD-10-CM | POA: Diagnosis present

## 2020-02-03 DIAGNOSIS — D649 Anemia, unspecified: Secondary | ICD-10-CM | POA: Diagnosis present

## 2020-02-03 DIAGNOSIS — O9902 Anemia complicating childbirth: Secondary | ICD-10-CM | POA: Diagnosis present

## 2020-02-03 DIAGNOSIS — O9912 Other diseases of the blood and blood-forming organs and certain disorders involving the immune mechanism complicating childbirth: Principal | ICD-10-CM | POA: Diagnosis present

## 2020-02-03 DIAGNOSIS — O26893 Other specified pregnancy related conditions, third trimester: Secondary | ICD-10-CM | POA: Diagnosis present

## 2020-02-03 DIAGNOSIS — Z3490 Encounter for supervision of normal pregnancy, unspecified, unspecified trimester: Secondary | ICD-10-CM | POA: Diagnosis present

## 2020-02-03 DIAGNOSIS — D693 Immune thrombocytopenic purpura: Secondary | ICD-10-CM | POA: Diagnosis present

## 2020-02-03 DIAGNOSIS — Z20822 Contact with and (suspected) exposure to covid-19: Secondary | ICD-10-CM | POA: Diagnosis present

## 2020-02-03 DIAGNOSIS — Z8759 Personal history of other complications of pregnancy, childbirth and the puerperium: Secondary | ICD-10-CM

## 2020-02-03 LAB — TYPE AND SCREEN
ABO/RH(D): O NEG
Antibody Screen: NEGATIVE

## 2020-02-03 LAB — CBC
HCT: 33 % — ABNORMAL LOW (ref 36.0–46.0)
Hemoglobin: 11 g/dL — ABNORMAL LOW (ref 12.0–15.0)
MCH: 28.2 pg (ref 26.0–34.0)
MCHC: 33.3 g/dL (ref 30.0–36.0)
MCV: 84.6 fL (ref 80.0–100.0)
Platelets: 160 10*3/uL (ref 150–400)
RBC: 3.9 MIL/uL (ref 3.87–5.11)
RDW: 15.1 % (ref 11.5–15.5)
WBC: 10.6 10*3/uL — ABNORMAL HIGH (ref 4.0–10.5)
nRBC: 0 % (ref 0.0–0.2)

## 2020-02-03 LAB — RESP PANEL BY RT-PCR (FLU A&B, COVID) ARPGX2
Influenza A by PCR: NEGATIVE
Influenza B by PCR: NEGATIVE
SARS Coronavirus 2 by RT PCR: NEGATIVE

## 2020-02-03 MED ORDER — OXYTOCIN-SODIUM CHLORIDE 30-0.9 UT/500ML-% IV SOLN
2.5000 [IU]/h | INTRAVENOUS | Status: DC
  Filled 2020-02-03: qty 500

## 2020-02-03 MED ORDER — LACTATED RINGERS IV SOLN: 500.0000 mL | INTRAVENOUS | Status: DC | PRN

## 2020-02-03 MED ORDER — LACTATED RINGERS IV SOLN: INTRAVENOUS | Status: DC

## 2020-02-03 MED ORDER — OXYTOCIN BOLUS FROM INFUSION
333.0000 mL | Freq: Once | INTRAVENOUS | Status: AC
  Administered 2020-02-05: 333 mL via INTRAVENOUS

## 2020-02-03 MED ORDER — SOD CITRATE-CITRIC ACID 500-334 MG/5ML PO SOLN: 30.0000 mL | ORAL | Status: DC | PRN

## 2020-02-03 MED ORDER — ACETAMINOPHEN 325 MG PO TABS: 650.0000 mg | ORAL_TABLET | ORAL | Status: DC | PRN

## 2020-02-03 MED ORDER — ONDANSETRON HCL 4 MG/2ML IJ SOLN: 4.0000 mg | Freq: Four times a day (QID) | INTRAMUSCULAR | Status: DC | PRN

## 2020-02-03 MED ORDER — LIDOCAINE HCL (PF) 1 % IJ SOLN: 30.0000 mL | INTRAMUSCULAR | Status: DC | PRN

## 2020-02-03 MED ORDER — TERBUTALINE SULFATE 1 MG/ML IJ SOLN: 0.2500 mg | Freq: Once | INTRAMUSCULAR | Status: DC | PRN

## 2020-02-03 MED ORDER — MISOPROSTOL 25 MCG QUARTER TABLET
25.0000 ug | ORAL_TABLET | ORAL | Status: AC | PRN
  Administered 2020-02-03 (×2): 25 ug via VAGINAL
  Filled 2020-02-03 (×2): qty 1

## 2020-02-03 NOTE — Plan of Care (Signed)
  Problem: Education: Goal: Knowledge of Childbirth will improve Outcome: Progressing   Problem: Coping: Goal: Ability to verbalize concerns and feelings about labor and delivery will improve Outcome: Progressing   Problem: Role Relationship: Goal: Will demonstrate positive interactions with the child Outcome: Progressing   Problem: Safety: Goal: Risk of complications during labor and delivery will decrease Outcome: Progressing   Problem: Pain Managment: Goal: General experience of comfort will improve Outcome: Progressing   IOL process reviewed with patient. Cervix closed/th/-3. Per MD order, cytotec administered. Pt understands plan of care. Pt partner at bedside.

## 2020-02-03 NOTE — H&P (Signed)
Heidi Blair is a 29 y.o. female presenting for IOL at 39 wks for dates, [facility] 02/10/20, borderline pelvis.  G1,spontaneous preg after surgery for endometriosis and uterine septum resection. Rh neg.Low placenta resolved at 32 wks. 4'15" 73%, AC 87%, Vx, AFI nl.  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 Endometriosis   . Frequency of urination   . History of kidney stones   . IBS (irritable bowel syndrome)    intermittant diarrhea  . ITP (idiopathic thrombocytopenic purpura)    mild - dx 2011-- per pt stable and montiored by pcp (dr Valentino Hue)  . Nocturia   . Uterus, septate   . Wears glasses    Past Surgical History:  Procedure Laterality Date  . COLONOSCOPY  2011  . HYSTEROSCOPY N/A 08/27/2015   Procedure: HYSTEROSCOPY, RESECTION OF UTERINE SEPTUM,  INSERTION OF SEPRAFILM CHANNEL ;  Surgeon: Fermin Schwab, MD;  Location: [facility];  Service: Gynecology;  Laterality: N/A;  . LAPAROSCOPY N/A 08/27/2015   Procedure: LAPAROSCOPY,  EXCISION OF ENDOMETROSIS, CHROMOTUBATION, LYSIS OF ADHESIONS;  Surgeon: Fermin Schwab, MD;  Location: [facility];  Service: Gynecology;  Laterality: N/A;  . URETEROLITHOTOMY  2014  . WISDOM TOOTH EXTRACTION  2011   Family History: family history is not on file. Social History:  reports that she has never smoked. She has never used smokeless tobacco. She reports current alcohol use. She reports that she does not use drugs.     Maternal Diabetes: No Genetic Screening: Normal QUAD Maternal Ultrasounds/Referrals: Normal Fetal Ultrasounds or other Referrals:  None Maternal Substance Abuse:  No Significant Maternal Medications:  None Significant Maternal Lab  Results:  Group B Strep negative Other Comments:  None  Review of Systems History Dilation: Fingertip Effacement (%): Thick Station: -3 Exam by:: B Heritage manager Blood pressure 121/69, pulse 95, temperature 98.6 F (37 C), temperature source Oral, resp. rate 18, height 5\' 3"  (1.6 m), weight 86 kg. Exam Physical Exam  Physical exam:  A&O x 3, no acute distress. Pleasant HEENT neg, no thyromegaly Lungs CTA bilat CV RRR, S1S2 normal Abdo soft, non tender, non acute Extr no edema/ tenderness Pelvic Per RN Vx accels no decels mod variab cat I Toco Irreg   Prenatal labs: ABO, Rh: --/--/O NEG (12/18 1445) Antibody: NEG (12/18 1445) Rubella: Immune (06/02 0000) RPR: Nonreactive (06/02 0000)  HBsAg: Negative (06/02 0000)  HIV: Non-reactive (06/02 0000)  GBS: Negative/-- (12/01 0000)   Assessment/Plan: 29 yo G1, 39 wks, IOL per request. Late admission due to no beds on L&D  Cytotec 1-2 doses, then pitocin as needed. EFW 7.1/2 lbs, FHT cat I Pelvis borderline Working towards vag delivery   26 02/03/2020, 8:37 PM

## 2020-02-04 ENCOUNTER — Inpatient Hospital Stay (HOSPITAL_COMMUNITY): Payer: 59 | Admitting: Anesthesiology

## 2020-02-04 LAB — RPR: RPR Ser Ql: NONREACTIVE

## 2020-02-04 MED ORDER — FENTANYL CITRATE (PF) 100 MCG/2ML IJ SOLN
100.0000 ug | Freq: Once | INTRAMUSCULAR | Status: AC
  Administered 2020-02-04: 100 ug via EPIDURAL
  Filled 2020-02-04: qty 2

## 2020-02-04 MED ORDER — EPHEDRINE 5 MG/ML INJ: 10.0000 mg | INTRAVENOUS | Status: DC | PRN

## 2020-02-04 MED ORDER — DIPHENHYDRAMINE HCL 50 MG/ML IJ SOLN: 12.5000 mg | INTRAMUSCULAR | Status: DC | PRN

## 2020-02-04 MED ORDER — PHENYLEPHRINE 40 MCG/ML (10ML) SYRINGE FOR IV PUSH (FOR BLOOD PRESSURE SUPPORT): 80.0000 ug | PREFILLED_SYRINGE | INTRAVENOUS | Status: DC | PRN

## 2020-02-04 MED ORDER — MISOPROSTOL 25 MCG QUARTER TABLET
25.0000 ug | ORAL_TABLET | Freq: Once | ORAL | Status: AC
  Administered 2020-02-04: 25 ug via VAGINAL
  Filled 2020-02-04: qty 1

## 2020-02-04 MED ORDER — LIDOCAINE HCL (PF) 1 % IJ SOLN
INTRAMUSCULAR | Status: DC | PRN
  Administered 2020-02-04 (×2): 5 mL via EPIDURAL

## 2020-02-04 MED ORDER — BUPIVACAINE HCL (PF) 0.25 % IJ SOLN
INTRAMUSCULAR | Status: DC | PRN
  Administered 2020-02-04 (×2): 5 mL via EPIDURAL

## 2020-02-04 MED ORDER — FENTANYL-BUPIVACAINE-NACL 0.5-0.125-0.9 MG/250ML-% EP SOLN
12.0000 mL/h | EPIDURAL | Status: DC | PRN
  Filled 2020-02-04: qty 250

## 2020-02-04 MED ORDER — FENTANYL CITRATE (PF) 100 MCG/2ML IJ SOLN
INTRAMUSCULAR | Status: DC | PRN
  Administered 2020-02-04: 100 ug via EPIDURAL

## 2020-02-04 MED ORDER — TERBUTALINE SULFATE 1 MG/ML IJ SOLN: 0.2500 mg | Freq: Once | INTRAMUSCULAR | Status: DC | PRN

## 2020-02-04 MED ORDER — SODIUM CHLORIDE (PF) 0.9 % IJ SOLN
INTRAMUSCULAR | Status: DC | PRN
  Administered 2020-02-04: 12 mL/h via EPIDURAL

## 2020-02-04 MED ORDER — BUTORPHANOL TARTRATE 1 MG/ML IJ SOLN
1.0000 mg | INTRAMUSCULAR | Status: DC | PRN
  Administered 2020-02-04 (×3): 1 mg via INTRAVENOUS
  Filled 2020-02-04 (×3): qty 1

## 2020-02-04 MED ORDER — LACTATED RINGERS IV SOLN
500.0000 mL | Freq: Once | INTRAVENOUS | Status: AC
  Administered 2020-02-04: 500 mL via INTRAVENOUS

## 2020-02-04 MED ORDER — OXYTOCIN-SODIUM CHLORIDE 30-0.9 UT/500ML-% IV SOLN
1.0000 m[IU]/min | INTRAVENOUS | Status: DC
  Administered 2020-02-04: 2 m[IU]/min via INTRAVENOUS

## 2020-02-04 NOTE — Progress Notes (Signed)
Patient ID: Violet Baldy, female   DOB: 07-Dec-1990, 29 y.o.   MRN: [id number] IOL 39.1 wks for ballotable head, borderline pelvis.  S/p 2 Cytotec doses and on pitocin since 4 am.  RN called reporting patient stating her baby might have become transverse lie again (was until 36-37 wks) and RN noted abdomen shape changed.   Bedside sono - cephalic at inlet but note a hand right by and under most part of the head and also spine is transverse/ back up and on left.  Cx exam- now 2/80%/ -4/ Vx . Controlled AROM done and head well applied to cervix but high. Also bloody fluid, possible marginal abruption?  Borderline pelvis.   FHT 130s + accels no decels mod variability  Toco every 2 min, pito at 8, Will drop to 4 mu and reassess  Recommend epidural and reassess after she relaxes more. Continue pitocin at lower dose and assess for bleeding/ engagement and position after epidural, keep NPO. Pt aware may need C/s. Agrees.   V.Tiaja Hagan MD

## 2020-02-04 NOTE — Anesthesia Preprocedure Evaluation (Signed)
Anesthesia Evaluation  Patient identified by MRN, date of birth, ID band Patient awake    Reviewed: Allergy & Precautions, H&P , NPO status , Patient's Chart, lab work & pertinent test results  History of Anesthesia Complications Negative for: history of anesthetic complications  Airway Mallampati: II  TM Distance: >3 FB Neck ROM: full    Dental no notable dental hx. (+) Teeth Intact   Pulmonary neg pulmonary ROS,    Pulmonary exam normal breath sounds clear to auscultation       Cardiovascular negative cardio ROS Normal cardiovascular exam Rhythm:regular Rate:Normal     Neuro/Psych negative neurological ROS  negative psych ROS   GI/Hepatic Neg liver ROS, IBS (irritable bowel syndrome)   Endo/Other  negative endocrine ROS  Renal/GU negative Renal ROS  negative genitourinary   Musculoskeletal   Abdominal (+) + obese,   Peds  Hematology  (+) Blood dyscrasia, anemia , ITP (idiopathic thrombocytopenic purpura)   Anesthesia Other Findings   Reproductive/Obstetrics (+) Pregnancy                             Anesthesia Physical Anesthesia Plan  ASA: II  Anesthesia Plan: Epidural   Post-op Pain Management:    Induction:   PONV Risk Score and Plan:   Airway Management Planned:   Additional Equipment:   Intra-op Plan:   Post-operative Plan:   Informed Consent: I have reviewed the patients History and Physical, chart, labs and discussed the procedure including the risks, benefits and alternatives for the proposed anesthesia with the patient or authorized representative who has indicated his/her understanding and acceptance.       Plan Discussed with:   Anesthesia Plan Comments:         Anesthesia Quick Evaluation

## 2020-02-04 NOTE — Progress Notes (Signed)
Labor Progress Note:  S/O: Patient pushing effectively  Vitals:   02/04/20 1800 02/04/20 1832  BP: 117/77 (!) 143/103  Pulse: 93   Resp: 18 18  Temp:  98.2 F (36.8 C)   SVE: 10/100/+2   EFM: cat II baseline 130 bpm mod var +accels, decels with pushing  Toco/IUPC: ctxs q 2-4 min   A/P: 29Y G1P0 @ 39.1 IOL pushing  GBS-/Rh-  -IOL: s/p 3 doses of Cytotec, s/p AROM, currently on Pitocin of 71mU cont per protocol- progressed well to fully dilated and now pushing with good maternal effort and effective pushing for a little over 1 hr now, pelvis feels adequate, no molding -cont EFM/IUPC monitoring -cat II tracing 2/2 late decel with pushing- spontaneously recovered, mod var overall reassuring tracing  -epidural labor pain mgmt -routine intrapartum care -anticipate vaginal delivery  Heidi Blair A Bao Bazen 02/04/20 9:44 PM

## 2020-02-04 NOTE — Anesthesia Procedure Notes (Signed)
Epidural Patient location during procedure: OB Start time: 02/04/2020 12:03 PM End time: 02/04/2020 12:13 PM  Staffing Anesthesiologist: Leonides Grills, MD Performed: anesthesiologist   Preanesthetic Checklist Completed: patient identified, IV checked, site marked, risks and benefits discussed, monitors and equipment checked, pre-op evaluation and timeout performed  Epidural Patient position: sitting Prep: DuraPrep Patient monitoring: heart rate, cardiac monitor, continuous pulse ox and blood pressure Approach: midline Location: L4-L5 Injection technique: LOR air  Needle:  Needle type: Tuohy  Needle gauge: 17 G Needle length: 9 cm Needle insertion depth: 7 cm Catheter type: closed end flexible Catheter size: 19 Gauge Catheter at skin depth: 12 cm Test dose: negative and Other  Assessment Events: blood not aspirated, injection not painful, no injection resistance and negative IV test  Additional Notes Informed consent obtained prior to proceeding including risk of failure, 1% risk of PDPH, risk of minor discomfort and bruising.  Discussed alternatives to epidural analgesia and patient desires to proceed.  Timeout performed pre-procedure verifying patient name, procedure, and platelet count.  Patient tolerated procedure well. Reason for block:procedure for pain

## 2020-02-04 NOTE — Progress Notes (Signed)
Patient ID: Heidi Blair, female   DOB: Aug 06, 1990, 29 y.o.   MRN: [id number] IOL 39.1 wks for ballotable head, borderline pelvis. Delayed admission due to no rooms on L&D.  S/p 3 Cytotec doses (4 pm, 8 pm, 12 MN) and on pitocin since 4 am.   There was concern for transverse lie of fetal spine with unengaged head and hand near the head on sono this morning. So planned AROM, epidural and right exaggerated Sims. Heavier bleeding noted earlier with concern for abruption but resolved, noting clear amniotic fluid.   C/o LLQ pain with UCs, received flew epidural boluses.was able to rest some.  BP 123/73   Pulse 99   Temp 98.5 F (36.9 C) (Oral)   Resp 18   Ht 5\' 3"  (1.6 m)   Wt 86 kg   BMI 33.60 kg/m   FHT 130s Mod variability, + accels, no decels.  Toco possiby q 2-4 min, so pitocin increased to 48mu  Cx - now 5-6 cm//90%/ -2/ Vx .Head well applied and entering inlet. Copious bloody show. No clots. No hand or fingers palpated.    Will sit up in chair position to allow passive descent with UCs and also have epidural help LLQ pain. Anesthesia to reassess pain.  FHT cat I Findings more reassuring for continuing vaginal birth attempts. Reassess in 2 hrs.    Informed patient Dr 11m takes call at 5 pm,will discuss and hand over care. Agrees.  V.Rey Fors MD

## 2020-02-04 NOTE — Progress Notes (Signed)
Labor Progress Note  S/O: Patient reports feeling ctxs but denies any feeling of vaginal/rectal pressure. Unsure if pushing in the correct location s/p trial push. Support members at bedside  Vitals:   02/04/20 1800 02/04/20 1832  BP: 117/77 (!) 143/103  Pulse: 93   Resp: 18 18  Temp:  98.2 F (36.8 C)    SVE: 10/100/+1  EFM: cat II baseline 145 bpm mod var +accels, late decel with pushing  Toco/IUPC: ctxs q 2-4 min  A/P: 29Y G1P0 @ 39.1 IOL GBS-/Rh-  -IOL: s/p 3 doses of Cytotec, s/p AROM, currently on Pitocin of 14mU cont per protocol- progressed well to fully dilated, moderate maternal effort- would benefit from labor down 30-60 min, or begin pushing sooner if inc pressure sensation -cont EFM/IUPC monitoring -cat II tracing 2/2 late decel with pushing- spontaneously recovered, mod var overall reassuring tracing  -epidural labor pain mgmt -routine intrapartum care -anticipate vaginal delivery  Arjan Strohm A Cherryl Babin 02/04/20 8:08 PM

## 2020-02-04 NOTE — Progress Notes (Signed)
Patient ID: Heidi Blair, female   DOB: [birth year], 29 y.o.   MRN: [id number] IOL 39.1 wks for ballotable head, borderline pelvis.  S/p 2 Cytotec doses and on pitocin since 4 am.   Now s/p eoidural. C/o LLQ pain with UCs. Anesthesia aware, giving more time to assess if pain improved.  Will decrease pito to 2 mu from 4 and reassess.  Abdo- fetal spine oblique left.  Cx exam- now 4/90%/ -3/ Vx .Head well applied. Don't feel hand through 4 cm. Amniotic fluid cleared up, no active bleeding.    FHT 130s + accels no decels mod variability  Toco every 2 min.  Exaggerated Sims to right side and assess if spine rotates to midline and see fetal descent.  Reassess in 2 hrs.  V.Lurine Imel MD

## 2020-02-05 ENCOUNTER — Encounter (HOSPITAL_COMMUNITY): Payer: Self-pay | Admitting: Obstetrics & Gynecology

## 2020-02-05 DIAGNOSIS — Z8759 Personal history of other complications of pregnancy, childbirth and the puerperium: Secondary | ICD-10-CM

## 2020-02-05 LAB — CBC
HCT: 32.6 % — ABNORMAL LOW (ref 36.0–46.0)
HCT: 30.5 % — ABNORMAL LOW (ref 36.0–46.0)
Hemoglobin: 10.3 g/dL — ABNORMAL LOW (ref 12.0–15.0)
Hemoglobin: 9.5 g/dL — ABNORMAL LOW (ref 12.0–15.0)
MCH: 27.2 pg (ref 26.0–34.0)
MCH: 27.2 pg (ref 26.0–34.0)
MCHC: 31.6 g/dL (ref 30.0–36.0)
MCHC: 31.1 g/dL (ref 30.0–36.0)
MCV: 86 fL (ref 80.0–100.0)
MCV: 87.4 fL (ref 80.0–100.0)
Platelets: 139 10*3/uL — ABNORMAL LOW (ref 150–400)
Platelets: 145 10*3/uL — ABNORMAL LOW (ref 150–400)
RBC: 3.79 MIL/uL — ABNORMAL LOW (ref 3.87–5.11)
RBC: 3.49 MIL/uL — ABNORMAL LOW (ref 3.87–5.11)
RDW: 15.4 % (ref 11.5–15.5)
RDW: 15.5 % (ref 11.5–15.5)
WBC: 20.2 10*3/uL — ABNORMAL HIGH (ref 4.0–10.5)
WBC: 21.9 10*3/uL — ABNORMAL HIGH (ref 4.0–10.5)
nRBC: 0 % (ref 0.0–0.2)
nRBC: 0 % (ref 0.0–0.2)

## 2020-02-05 MED ORDER — METHYLERGONOVINE MALEATE 0.2 MG/ML IJ SOLN: 0.2000 mg | Freq: Once | INTRAMUSCULAR | Status: DC

## 2020-02-05 MED ORDER — ZOLPIDEM TARTRATE 5 MG PO TABS: 5.0000 mg | ORAL_TABLET | Freq: Every evening | ORAL | Status: DC | PRN

## 2020-02-05 MED ORDER — ACETAMINOPHEN 325 MG PO TABS
650.0000 mg | ORAL_TABLET | ORAL | Status: DC | PRN
  Administered 2020-02-06: 650 mg via ORAL
  Filled 2020-02-05: qty 2

## 2020-02-05 MED ORDER — IBUPROFEN 600 MG PO TABS
600.0000 mg | ORAL_TABLET | Freq: Four times a day (QID) | ORAL | Status: DC
  Administered 2020-02-05 – 2020-02-07 (×10): 600 mg via ORAL
  Filled 2020-02-05 (×10): qty 1

## 2020-02-05 MED ORDER — PRENATAL MULTIVITAMIN CH
1.0000 | ORAL_TABLET | Freq: Every day | ORAL | Status: DC
  Administered 2020-02-05 – 2020-02-07 (×3): 1 via ORAL
  Filled 2020-02-05 (×3): qty 1

## 2020-02-05 MED ORDER — ONDANSETRON HCL 4 MG/2ML IJ SOLN: 4.0000 mg | INTRAMUSCULAR | Status: DC | PRN

## 2020-02-05 MED ORDER — ONDANSETRON HCL 4 MG PO TABS: 4.0000 mg | ORAL_TABLET | ORAL | Status: DC | PRN

## 2020-02-05 MED ORDER — COCONUT OIL OIL
1.0000 "application " | TOPICAL_OIL | Status: DC | PRN
  Administered 2020-02-05: 1 via TOPICAL

## 2020-02-05 MED ORDER — SENNOSIDES-DOCUSATE SODIUM 8.6-50 MG PO TABS
2.0000 | ORAL_TABLET | Freq: Every day | ORAL | Status: DC
  Administered 2020-02-06 – 2020-02-07 (×2): 2 via ORAL
  Filled 2020-02-05 (×2): qty 2

## 2020-02-05 MED ORDER — BENZOCAINE-MENTHOL 20-0.5 % EX AERO
1.0000 "application " | INHALATION_SPRAY | CUTANEOUS | Status: DC | PRN
  Administered 2020-02-05: 1 via TOPICAL
  Filled 2020-02-05: qty 56

## 2020-02-05 MED ORDER — DIPHENHYDRAMINE HCL 25 MG PO CAPS: 25.0000 mg | ORAL_CAPSULE | Freq: Four times a day (QID) | ORAL | Status: DC | PRN

## 2020-02-05 MED ORDER — DIBUCAINE (PERIANAL) 1 % EX OINT: 1.0000 "application " | TOPICAL_OINTMENT | CUTANEOUS | Status: DC | PRN

## 2020-02-05 MED ORDER — WITCH HAZEL-GLYCERIN EX PADS: 1.0000 "application " | MEDICATED_PAD | CUTANEOUS | Status: DC | PRN

## 2020-02-05 MED ORDER — SIMETHICONE 80 MG PO CHEW: 80.0000 mg | CHEWABLE_TABLET | ORAL | Status: DC | PRN

## 2020-02-05 MED ORDER — METHYLERGONOVINE MALEATE 0.2 MG/ML IJ SOLN
INTRAMUSCULAR | Status: AC
  Administered 2020-02-05: 0.2 mg
  Filled 2020-02-05: qty 1

## 2020-02-05 MED ORDER — TETANUS-DIPHTH-ACELL PERTUSSIS 5-2.5-18.5 LF-MCG/0.5 IM SUSY: 0.5000 mL | PREFILLED_SYRINGE | Freq: Once | INTRAMUSCULAR | Status: DC

## 2020-02-05 NOTE — Lactation Note (Signed)
This note was copied from a baby's chart. Lactation Consultation Note Baby 6 hrs old. Baby alert after stimulated to wake up.  Mom needed to go to [facility] w/steady d/t leg still slightly sleepy. LC waited on mom to come out of BR.  Mom has short shaft nipples. Compressible. Colostrum noted. In football hold attempted to latch. Baby would latch and hold nipple w/only a couple of suckles.. pushing off at intervals, then having to re-latch. Baby cueing searching for nipple. Explained to mom, baby is learning will take time.  Newborn behavior, feeding habits, STS, I&O, breast massage, supply and demand discussed. Mom encouraged to feed baby 8-12 times/24 hours and with feeding cues.   Encouraged to call for assistance or questions. Lactation brochure given.  Patient Name: Heidi Blair [id number] Date: 02/05/2020 Reason for consult: Initial assessment;Term;Primapara   Maternal Data Has patient been taught Hand Expression?: Yes Does the patient have breastfeeding experience prior to this delivery?: No  Feeding Feeding Type: Breast Fed  LATCH Score Latch: Too sleepy or reluctant, no latch achieved, no sucking elicited.  Audible Swallowing: None  Type of Nipple: Everted at rest and after stimulation (short shaft compressible nipples)  Comfort (Breast/Nipple): Soft / non-tender  Hold (Positioning): Assistance needed to correctly position infant at breast and maintain latch.  LATCH Score: 5  Interventions Interventions: Breast feeding basics reviewed;Assisted with latch;Breast compression;Skin to skin;Adjust position;Breast massage;Support pillows;Hand express;Position options  Lactation Tools Discussed/Used WIC Program: No   Consult Status Consult Status: Follow-up Date: 02/05/20 Follow-up type: In-patient    Charyl Dancer 02/05/2020, 6:51 AM

## 2020-02-05 NOTE — Lactation Note (Addendum)
This note was copied from a baby's chart. Lactation Consultation Note  Patient Name: Heidi Blair Date: 02/05/2020 Reason for consult: Follow-up assessment;Mother's request;Difficult latch;Primapara;1st time breastfeeding;Term  Infant is 39 weeks 17 hours old. Infant not feeding well according to parents. Mom has short shaft nipples. She has a Motif pump at home. Feeding times today 10 am on and off for 15 minutes and 1:30 pm drops of colostrum via finger feeding.   Physical exam of Mom's breasts, right breast one cup size larger than the left. Mom states that is normal for her. No signs of nipple trauma, right breast slightly more compressed than the left. RN to provide coconut oil for nipple care.   Infant placed STS attempted to latch with flat nipples.  Infant holds on to the breast tissue but will not suck. LC did suck training an infant able to extend tongue pass the gum line and cup the finger. LC applied 20 then 16 NS. Infant again latch but not suck. LC reviewed with parents donor breast milk consent and parents signed consent form.   Infant primed with 3 ml of DBM and started to empty NS and then showed signs of milk transfer once NS was empty for about 6 minutes. Infant fed 7 ml of DBM via paced bottle feeding and slow flow nipple. DEBP set up with Mom. LC reviewed parts, assembly cleaning and storage. RN, Pilar Jarvis to get more DBM to get parents through the night.   Plan 1. To feed based on cues 8-12 x in 24 hour period no more than 4 hours without an attempt. Parents place infant STS at both breasts and look for signs of milk transfer. If unable to sustain a latch, can apply NS primed with DBM.         2. Paced bottle feed based on breastfeeding supplementation guidelines 7-10 ml increase as tolerated.        3. Mom to pump using DEBP q 3 hours for 15 minutes.

## 2020-02-05 NOTE — Lactation Note (Signed)
This note was copied from a baby's chart. Lactation Consultation Note Baby 4 hrs old.  Attempted to see mom. Mom woke when [facility] opened door. Asked mom when was it time for baby to feed, mom told LC. Asked mom how things where going, mom stated OK. Asked mom to call for [facility] for the next feeding. Mom stated OK. Left Lactation brochure at bedside.  Patient Name: Heidi Blair [id number] Date: 02/05/2020 Reason for consult: Initial assessment;Primapara;Term   Maternal Data Does the patient have breastfeeding experience prior to this delivery?: No  Feeding Feeding Type: Breast Fed  LATCH Score Latch: Too sleepy or reluctant, no latch achieved, no sucking elicited.  Audible Swallowing: None  Type of Nipple: Everted at rest and after stimulation (short shaft)  Comfort (Breast/Nipple): Soft / non-tender  Hold (Positioning): Full assist, staff holds infant at breast  LATCH Score: 4  Interventions    Lactation Tools Discussed/Used     Consult Status Consult Status: Follow-up Date: 02/05/20 Follow-up type: In-patient    Heidi Blair, [name] 02/05/2020, 5:02 AM

## 2020-02-05 NOTE — Anesthesia Postprocedure Evaluation (Signed)
Anesthesia Post Note  Patient: Heidi Blair  Procedure(s) Performed: AN AD HOC LABOR EPIDURAL     Patient location during evaluation: Mother Baby Anesthesia Type: Epidural Level of consciousness: awake, awake and alert, oriented and patient cooperative Pain management: pain level controlled Vital Signs Assessment: post-procedure vital signs reviewed and stable Respiratory status: spontaneous breathing Cardiovascular status: blood pressure returned to baseline Postop Assessment: no headache, patient able to bend at knees, no apparent nausea or vomiting, adequate PO intake, no backache and able to ambulate Anesthetic complications: no   No complications documented.  Last Vitals:  Vitals:   02/05/20 0345 02/05/20 0845  BP: 128/80 118/72  Pulse: 99 93  Resp: 18 17  Temp: 36.9 C 36.8 C  SpO2: 99% 99%    Last Pain:  Vitals:   02/05/20 0845  TempSrc: Oral  PainSc: 2    Pain Goal:                   Jennelle Human

## 2020-02-05 NOTE — Lactation Note (Signed)
This note was copied from a baby's chart. Lactation Consultation Note L&D visit Mom holding baby STS. They hadn't been long finished sewing mom up per family member stated. Hadn't tried to latch yet. Glad LC arrived will try to latch now. Mom stated that she can hand express colostrum. Praised mom. In cradle position to Lt. Breast baby attempted to latch to compressible short shaft everted nipple. Baby off and on bobbing head around trying to find nipple. Once gets nipple, baby may suckle once or twice then pops off. Explained baby is exploring and learning. RN needed to check mom, family member took baby. LC told mom that I would visit upstairs. Mom is exhausted. Mom pushed 4 hrs. Congratulated parents.  Patient Name: Heidi Blair [id number] Date: 02/05/2020 Reason for consult: Initial assessment;Primapara;Term   Maternal Data Does the patient have breastfeeding experience prior to this delivery?: No  Feeding    LATCH Score Latch: Too sleepy or reluctant, no latch achieved, no sucking elicited.  Audible Swallowing: None  Type of Nipple: Everted at rest and after stimulation (short shaft)  Comfort (Breast/Nipple): Soft / non-tender  Hold (Positioning): Full assist, staff holds infant at breast  LATCH Score: 4  Interventions    Lactation Tools Discussed/Used     Consult Status Consult Status: Follow-up Date: 02/05/20 Follow-up type: In-patient    Danyka Merlin, [name] 02/05/2020, 1:35 AM

## 2020-02-06 DIAGNOSIS — O9902 Anemia complicating childbirth: Secondary | ICD-10-CM

## 2020-02-06 DIAGNOSIS — Z6791 Unspecified blood type, Rh negative: Secondary | ICD-10-CM | POA: Diagnosis present

## 2020-02-06 MED ORDER — MAGNESIUM OXIDE 400 (241.3 MG) MG PO TABS
400.0000 mg | ORAL_TABLET | Freq: Every day | ORAL | Status: DC
  Administered 2020-02-06 – 2020-02-07 (×2): 400 mg via ORAL
  Filled 2020-02-06 (×2): qty 1

## 2020-02-06 MED ORDER — POLYSACCHARIDE IRON COMPLEX 150 MG PO CAPS
150.0000 mg | ORAL_CAPSULE | Freq: Every day | ORAL | Status: DC
  Administered 2020-02-06 – 2020-02-07 (×2): 150 mg via ORAL
  Filled 2020-02-06 (×2): qty 1

## 2020-02-06 MED ORDER — RHO D IMMUNE GLOBULIN 1500 UNIT/2ML IJ SOSY
300.0000 ug | PREFILLED_SYRINGE | Freq: Once | INTRAMUSCULAR | Status: AC
  Administered 2020-02-06: 300 ug via INTRAVENOUS
  Filled 2020-02-06: qty 2

## 2020-02-06 NOTE — Lactation Note (Signed)
This note was copied from a baby's chart. Lactation Consultation Note Baby 26 hrs old. Attempted to see mom and baby. Both are sleeping. Spoke w/RN. RN stated baby is very spitty not feeding well. LC will attempt again tonight.  Patient Name: Heidi Blair Date: 02/06/2020     Maternal Data Has patient been taught Hand Expression?: Yes  Feeding    LATCH Score                   Interventions    Lactation Tools Discussed/Used     Consult Status      Heidi Blair, [name] 02/06/2020, 2:29 AM

## 2020-02-06 NOTE — Lactation Note (Signed)
This note was copied from a baby's chart. Lactation Consultation Note  Patient Name: Heidi Blair [id number] Date: 02/06/2020 Reason for consult: Follow-up assessment;Primapara;1st time breastfeeding;Term;Infant weight loss;Other (Comment) (4 % weight loss/ post circ / sleepy)  Baby is 35 hours old, MBURN called LC for moms request to been seen.  1st part of consult, baby asleep, and LC resized mom for a Nipple Shield.  The #16 to small, #20 NS boarder line snug fitting.  Baby waking up , LC offered to assist with latch and LC sized for #24 NS( good fit) instilled the EBM and baby did latch not opening wide  and LC ease the chin down to accommodate the base of the #24 NS well. Fed for approx 8 mins and then seemed relaxed.  LC recommended since the baby has been a DL and the baby is older and only taking 4-5 ml of donor milk at a feeding to increase the volume.  LC showed mom how to [employer] the baby to work on the baby to open wide for a latch at the breast. Baby pace fed well and took 15 ml.  LC encouraged mom to post pump after the feeding both breast and save milk for the next feeding.  DEBP was already set up.  LC also instructed mom on the use of the breast shells while awake to elongate the nipple / areola complex.      Maternal Data Has patient been taught Hand Expression?: Yes  Feeding Feeding Type: Breast Milk  LATCH Score                   Interventions Interventions: Breast feeding basics reviewed  Lactation Tools Discussed/Used Pump Review: Milk Storage   Consult Status Consult Status: Follow-up Date: 02/06/20 Follow-up type: In-patient    Heidi Blair 02/06/2020, 11:44 AM

## 2020-02-06 NOTE — Lactation Note (Signed)
This note was copied from a baby's chart. Lactation Consultation Note Baby 65 hrs old. Mom using DEBP collecting some colostrum. Praised mom. Mom stated baby has been fussy since he had his circumcision.  Baby awake looking around.  Mom stated feedings are going well. Mom is latching w/NS, inserting some colostrum/DM into NS, then giving supplement in bottle. Encouraged mom to increase supplement tonight. Supplemental information sheet given. Mom is post pumping. Mom states she has no questions or concerns at this time. Encouraged mom to call for assistance or questions.  Patient Name: Heidi Blair [id number] Date: 02/06/2020 Reason for consult: Follow-up assessment;Term   Maternal Data    Feeding    LATCH Score                   Interventions Interventions: DEBP  Lactation Tools Discussed/Used     Consult Status Consult Status: Follow-up Date: 02/06/20 Follow-up type: In-patient    Charyl Dancer 02/06/2020, 11:03 PM

## 2020-02-06 NOTE — Progress Notes (Signed)
PPD # 1 S/P VAVD  Live born female  Birth Weight: 7 lb 3.7 oz (3280 g) APGAR: 9, 9  Newborn Delivery   Birth date/time: 02/05/2020 00:21:00 Delivery type: Vaginal, Vacuum (Extractor)     Baby name: Howl Delivering provider: LAW, CASSANDRA A   Episiotomy:None   Lacerations:2nd degree   Circumcision Yes, complete  Feeding: breast  Pain control at delivery: Epidural   Subjective   Reports feeling well. Reports mild perineal pain but no cramping. Breastfeeding with assistance from lactation.              Tolerating po/ No nausea or vomiting             Bleeding is moderate             Pain controlled with acetaminophen and ibuprofen (OTC)             Up ad lib / ambulatory / voiding without difficulties   Objective   A & O x 3, in no apparent distress              VS:  Vitals:   02/05/20 1312 02/05/20 1550 02/05/20 2125 02/06/20 0600  BP: 117/75 (!) 108/55 110/79 114/79  Pulse: (!) 110 (!) 112 (!) 104 97  Resp:  16 16 16   Temp:  98.7 F (37.1 C) 98.4 F (36.9 C) 99.1 F (37.3 C)  TempSrc:  Oral Oral Oral  SpO2: 99% 99% 98% 98%  Weight:      Height:        LABS:  Recent Labs    02/05/20 0121 02/05/20 0706  WBC 20.2* 21.9*  HGB 10.3* 9.5*  HCT 32.6* 30.5*  PLT 139* 145*     Blood type: --/--/O NEG (12/18 1445)  Rubella: Immune (06/02 0000)   Vaccines:   TDaP   UTD                   Flu       Declined                             COVID-19 Declined   Gen: AAO x 3, NAD  Abdomen: soft, non-tender, non-distended             Fundus: firm, non-tender, U-1  Perineum: repair intact  Lochia: moderate  Extremities: no edema, no calf pain or tenderness   Assessment/Plan PPD # 1 29 y.o., G1P1001  Principal Problem:   Postpartum care following vaginal delivery 12/20  Doing well - stable status  Routine post partum orders  Breastfeeding support prn Active Problems:   Encounter for induction of labor   Idiopathic thrombocytopenia (HCC)  Stable  Platelets  this AM 145   Status post vacuum-assisted vaginal delivery   Perineal laceration, second degree  Discussed perineal care and comfort measures.   RhD negative  Will give Rhogam today, baby is Rh+   Maternal anemia, with delivery  Hemoglobin 9.5  Asymptomatic  Will start PO Niferex and mag oxide  Anticipate discharge tomorrow.   1/21, MSN, CNM 02/06/2020, 11:36 AM

## 2020-02-07 LAB — RH IG WORKUP (INCLUDES ABO/RH)
ABO/RH(D): O NEG
Fetal Screen: NEGATIVE
Gestational Age(Wks): 39
Unit division: 0

## 2020-02-07 MED ORDER — POLYSACCHARIDE IRON COMPLEX 150 MG PO CAPS: 150.0000 mg | ORAL_CAPSULE | Freq: Every day | ORAL | 1 refills | Status: AC

## 2020-02-07 MED ORDER — ACETAMINOPHEN 325 MG PO TABS: 650.0000 mg | ORAL_TABLET | ORAL | 1 refills | Status: AC | PRN

## 2020-02-07 MED ORDER — MAGNESIUM OXIDE 400 (241.3 MG) MG PO TABS: 400.0000 mg | ORAL_TABLET | Freq: Every day | ORAL | 1 refills | Status: AC

## 2020-02-07 MED ORDER — BENZOCAINE-MENTHOL 20-0.5 % EX AERO: 1.0000 "application " | INHALATION_SPRAY | CUTANEOUS | 1 refills | Status: AC | PRN

## 2020-02-07 MED ORDER — IBUPROFEN 600 MG PO TABS: 600.0000 mg | ORAL_TABLET | Freq: Four times a day (QID) | ORAL | 0 refills | Status: AC

## 2020-02-07 NOTE — Lactation Note (Signed)
This note was copied from a baby's chart. Lactation Consultation Note  Patient Name: Heidi Blair [id number] Date: 02/07/2020 Reason for consult: Follow-up assessment;Mother's request  A 5 Fr/syringe was used to supplement at the breast & Mom said it is "definitely a lot easier" than her previous method. Mom did an excellent job of very slowing pushing the plunger of the syringe and timing it with his suckling. Infant nursed and was supplemented until he was satiated. Mom knows that if he cues again soon, she can either do it again on the other breast or give him a bottle to finish the feeding.  Mom was pleased with this supplementing method and plans to use it at home along with bottle-feeding. Mom instructed to pump whenever infant receives formula.   Parents know how to reach [place] for post-discharge questions. Dad was shown how to wash the 5Fr & syringe. An extra 5 Fr and syringes were provided (including 35-mL syringes) and an extra size 24 nipple shield were provided.   [name] [facility] 02/07/2020, 10:22 AM

## 2020-02-07 NOTE — Discharge Summary (Signed)
OB Discharge Summary  Patient Name: Heidi Blair DOB: 06/25/1990 MRN: [id number]  Date of admission: 02/03/2020 Delivering provider: Clance Boll A   Admitting diagnosis: Encounter for induction of labor [Z34.90] Intrauterine pregnancy: [redacted]w[redacted]d     Secondary diagnosis: Patient Active Problem List   Diagnosis Date Noted  . RhD negative 02/06/2020  . Maternal anemia, with delivery 02/06/2020  . Postpartum care following vaginal delivery 12/20 02/05/2020  . Status post vacuum-assisted vaginal delivery 02/05/2020  . Perineal laceration, second degree 02/05/2020  . Encounter for induction of labor 02/03/2020  . Idiopathic thrombocytopenia (HCC) 01/17/2015    Date of discharge: 02/07/2020   Discharge diagnosis: Principal Problem:   Postpartum care following vaginal delivery 12/20 Active Problems:   Encounter for induction of labor   Idiopathic thrombocytopenia (HCC)   Status post vacuum-assisted vaginal delivery   Perineal laceration, second degree   RhD negative   Maternal anemia, with delivery                                                            Post partum procedures:rhogam  Augmentation: AROM, Pitocin and Cytotec Pain control: Epidural  Laceration:2nd degree  Episiotomy:None  Complications: None  Hospital course:  Induction of Labor With Vaginal Delivery   29 y.o. yo G1P1001 at [redacted]w[redacted]d was admitted to the hospital 02/03/2020 for induction of labor.  Indication for induction: Elective.  Patient had an uncomplicated labor course as follows: Membrane Rupture Time/Date: 11:24 AM ,02/04/2020   Delivery Method:Vaginal, Vacuum (Extractor)  Episiotomy: None  Lacerations:  2nd degree  Details of delivery can be found in separate delivery note.  Patient had a routine postpartum course. Patient is discharged home 02/07/20.  Newborn Data: Birth date:02/05/2020  Birth time:12:21 AM  Gender:Female  Living status:Living  Apgars:9 ,9  Weight:3280 g   Physical exam   Vitals:   02/06/20 0600 02/06/20 1408 02/06/20 2021 02/07/20 0500  BP: 114/79 112/66 121/69 101/69  Pulse: 97 (!) 108 99 87  Resp: 16 16 15 16   Temp: 99.1 F (37.3 C) 97.6 F (36.4 C) 98 F (36.7 C) 98.1 F (36.7 C)  TempSrc: Oral Oral  Oral  SpO2: 98% 98% 99%   Weight:      Height:       General: alert, cooperative and no distress Lochia: appropriate Uterine Fundus: firm Perineum: repair intact, no edema DVT Evaluation: No evidence of DVT seen on physical exam. No significant calf/ankle edema. Labs: Lab Results  Component Value Date   WBC 21.9 (H) 02/05/2020   HGB 9.5 (L) 02/05/2020   HCT 30.5 (L) 02/05/2020   MCV 87.4 02/05/2020   PLT 145 (L) 02/05/2020   CMP Latest Ref Rng & Units 05/21/2016  Glucose 65 - 99 mg/dL 87  BUN 6 - 20 mg/dL 11  Creatinine 07/21/2016 - 0.25 mg/dL 8.52  Sodium 7.78 - 242 mmol/L 139  Potassium 3.5 - 5.1 mmol/L 3.3(L)  Chloride 101 - 111 mmol/L 105  CO2 22 - 32 mmol/L 23  Calcium 8.9 - 10.3 mg/dL 9.2  Total Protein 6.0 - 8.3 g/dL -  Total Bilirubin 0.2 - 1.2 mg/dL -  Alkaline Phos 39 - 353 U/L -  AST 0 - 37 U/L -  ALT 0 - 35 U/L -   Edinburgh Postnatal Depression Scale Screening Tool  02/05/2020  I have been able to laugh and see the funny side of things. 0  I have looked forward with enjoyment to things. 0  I have blamed myself unnecessarily when things went wrong. 0  I have been anxious or worried for no good reason. 0  I have felt scared or panicky for no good reason. 1  Things have been getting on top of me. 0  I have been so unhappy that I have had difficulty sleeping. 0  I have felt sad or miserable. 0  I have been so unhappy that I have been crying. 0  The thought of harming myself has occurred to me. 0  Edinburgh Postnatal Depression Scale Total 1   Vaccines: TDaP UTD         Flu    Declined         COVID-19   Declined  Discharge instructions:  per After Visit Summary and [facility] OB booklet  After Visit Meds:  Allergies as  of 02/07/2020      Reactions   Hydrocodone Nausea And Vomiting   SEVERE      Medication List    STOP taking these medications   Eluxadoline 100 MG Tabs Commonly known as: Viberzi   etonogestrel-ethinyl estradiol 0.12-0.015 MG/24HR vaginal ring Commonly known as: NUVARING   ondansetron 4 MG disintegrating tablet Commonly known as: Zofran ODT   valACYclovir 500 MG tablet Commonly known as: VALTREX     TAKE these medications   acetaminophen 325 MG tablet Commonly known as: Tylenol Take 2 tablets (650 mg total) by mouth every 4 (four) hours as needed (for pain scale < 4).   benzocaine-Menthol 20-0.5 % Aero Commonly known as: DERMOPLAST Apply 1 application topically as needed for irritation (perineal discomfort).   citalopram 20 MG tablet Commonly known as: CELEXA Take 20 mg by mouth every evening.   ibuprofen 600 MG tablet Commonly known as: ADVIL Take 1 tablet (600 mg total) by mouth every 6 (six) hours.   iron polysaccharides 150 MG capsule Commonly known as: Ferrex 150 Take 1 capsule (150 mg total) by mouth daily.   magnesium oxide 400 (241.3 Mg) MG tablet Commonly known as: MAG-OX Take 1 tablet (400 mg total) by mouth daily. Start taking on: February 08, 2020      Diet: routine diet  Activity: Advance as tolerated. Pelvic rest for 6 weeks.   Newborn Data: Live born female  Birth Weight: 7 lb 3.7 oz (3280 g) APGAR: 9, 9  Newborn Delivery   Birth date/time: 02/05/2020 00:21:00 Delivery type: Vaginal, Vacuum (Extractor)     Named Howl Baby Feeding: Bottle and Breast Disposition:home with mother  Delivery Report:  Review the Delivery Report for details.    Follow up:  Follow-up Information    Shea Evans, MD. Schedule an appointment as soon as possible for a visit in 6 week(s).   Specialty: Obstetrics and Gynecology Why: Please make an appointment for 6 weeks postpartum.  Contact information: [address] [place]  [address] [phone]              Clancy Gourd, MSN 02/07/2020, 10:49 AM

## 2020-02-07 NOTE — Lactation Note (Signed)
This note was copied from a baby's chart. Lactation Consultation Note  Patient Name: Heidi Blair [id number] Date: 02/07/2020   Infant is 25 hrs old. Mom has been using size the 20 or 24 nipple shield to latch. Breast assessment shows that she could use a 24 for both breasts (or a size for the L). Size 24 flanges are appropriate for her when pumping. Mom last pumped last night around MN-0200 & got about 2-3 mL.  Parents have been taking a curved-tip syringe and putting it under the nipple shield. To make supplementing at the breast easier, I talked to parents about trying a 5Fr/syringe under the nipple shield for supplementing at breast. Mom is amenable to that. Mom will call for me when infant is ready to feed again.  Mom reports that her breasts seem bigger since birth.   I provided the formula feeding supplementation sheet which is more appropriate for an infant that is receiving most of his volume with a bottle.    [name] [facility] 02/07/2020, 8:47 AM

## 2020-12-03 DIAGNOSIS — Z862 Personal history of diseases of the blood and blood-forming organs and certain disorders involving the immune mechanism: Secondary | ICD-10-CM | POA: Diagnosis not present
